# Patient Record
Sex: Female | Born: 1935 | Race: White | Smoking: Never smoker
Health system: Southern US, Community
[De-identification: ages and names within clinical notes are randomized; demographics above are authoritative.]

## PROBLEM LIST (undated history)

## (undated) DIAGNOSIS — C55 Malignant neoplasm of uterus, part unspecified: Secondary | ICD-10-CM

## (undated) DIAGNOSIS — K449 Diaphragmatic hernia without obstruction or gangrene: Secondary | ICD-10-CM

## (undated) DIAGNOSIS — S32009A Unspecified fracture of unspecified lumbar vertebra, initial encounter for closed fracture: Secondary | ICD-10-CM

## (undated) DIAGNOSIS — Z8719 Personal history of other diseases of the digestive system: Secondary | ICD-10-CM

## (undated) DIAGNOSIS — E039 Hypothyroidism, unspecified: Secondary | ICD-10-CM

## (undated) DIAGNOSIS — E785 Hyperlipidemia, unspecified: Secondary | ICD-10-CM

## (undated) DIAGNOSIS — F0390 Unspecified dementia without behavioral disturbance: Secondary | ICD-10-CM

## (undated) DIAGNOSIS — M810 Age-related osteoporosis without current pathological fracture: Secondary | ICD-10-CM

## (undated) DIAGNOSIS — B351 Tinea unguium: Secondary | ICD-10-CM

## (undated) DIAGNOSIS — I059 Rheumatic mitral valve disease, unspecified: Secondary | ICD-10-CM

## (undated) DIAGNOSIS — S22009A Unspecified fracture of unspecified thoracic vertebra, initial encounter for closed fracture: Secondary | ICD-10-CM

## (undated) DIAGNOSIS — K5790 Diverticulosis of intestine, part unspecified, without perforation or abscess without bleeding: Secondary | ICD-10-CM

## (undated) DIAGNOSIS — Z8701 Personal history of pneumonia (recurrent): Secondary | ICD-10-CM

## (undated) DIAGNOSIS — C20 Malignant neoplasm of rectum: Secondary | ICD-10-CM

## (undated) DIAGNOSIS — Z87442 Personal history of urinary calculi: Secondary | ICD-10-CM

## (undated) HISTORY — DX: Personal history of pneumonia (recurrent): Z87.01

## (undated) HISTORY — DX: Tinea unguium: B35.1

## (undated) HISTORY — DX: Rheumatic mitral valve disease, unspecified: I05.9

## (undated) HISTORY — DX: Personal history of urinary calculi: Z87.442

## (undated) HISTORY — DX: Hyperlipidemia, unspecified: E78.5

## (undated) HISTORY — DX: Unspecified fracture of unspecified thoracic vertebra, initial encounter for closed fracture: S22.009A

## (undated) HISTORY — DX: Malignant neoplasm of uterus, part unspecified: C55

## (undated) HISTORY — PX: HAMMER TOE SURGERY: SHX385

## (undated) HISTORY — DX: Unspecified fracture of unspecified lumbar vertebra, initial encounter for closed fracture: S32.009A

## (undated) HISTORY — DX: Personal history of other diseases of the digestive system: Z87.19

## (undated) HISTORY — DX: Unspecified dementia, unspecified severity, without behavioral disturbance, psychotic disturbance, mood disturbance, and anxiety: F03.90

## (undated) HISTORY — DX: Malignant neoplasm of rectum: C20

## (undated) HISTORY — PX: OTHER SURGICAL HISTORY: SHX169

## (undated) HISTORY — DX: Diaphragmatic hernia without obstruction or gangrene: K44.9

## (undated) HISTORY — PX: EYE SURGERY: SHX253

## (undated) HISTORY — PX: FLEXIBLE SIGMOIDOSCOPY: SHX1649

## (undated) HISTORY — DX: Diverticulosis of intestine, part unspecified, without perforation or abscess without bleeding: K57.90

## (undated) HISTORY — DX: Hypothyroidism, unspecified: E03.9

---

## 1989-04-03 DIAGNOSIS — C55 Malignant neoplasm of uterus, part unspecified: Secondary | ICD-10-CM

## 1989-04-03 HISTORY — DX: Malignant neoplasm of uterus, part unspecified: C55

## 1994-08-03 HISTORY — PX: ABDOMINAL HYSTERECTOMY: SHX81

## 2009-12-01 DIAGNOSIS — C20 Malignant neoplasm of rectum: Secondary | ICD-10-CM

## 2009-12-01 HISTORY — PX: COLONOSCOPY: SHX174

## 2009-12-01 HISTORY — DX: Malignant neoplasm of rectum: C20

## 2011-09-10 ENCOUNTER — Encounter: Payer: Self-pay | Admitting: Gastroenterology

## 2011-09-15 ENCOUNTER — Telehealth: Payer: Self-pay | Admitting: Gastroenterology

## 2011-09-15 NOTE — Telephone Encounter (Signed)
Received copies from Dr. Senaida Ores at [facility] 09/15/11. Forwarded 13pages to Dr. Oren Section review.

## 2011-10-15 ENCOUNTER — Ambulatory Visit: Payer: Self-pay | Admitting: Gastroenterology

## 2011-12-10 ENCOUNTER — Ambulatory Visit (INDEPENDENT_AMBULATORY_CARE_PROVIDER_SITE_OTHER): Payer: Medicare Other | Admitting: Internal Medicine

## 2011-12-10 ENCOUNTER — Encounter: Payer: Self-pay | Admitting: Internal Medicine

## 2011-12-10 VITALS — BP 102/56 | HR 72 | Ht 65.0 in | Wt 125.0 lb

## 2011-12-10 DIAGNOSIS — C20 Malignant neoplasm of rectum: Secondary | ICD-10-CM

## 2011-12-10 NOTE — Patient Instructions (Signed)
You have been scheduled for a flexible sigmoidoscopy. Please follow the written instructions given to you at your visit today. 

## 2011-12-10 NOTE — Assessment & Plan Note (Addendum)
Diagnosed at colonoscopy May 2011, [place] N1 s/p EUS at [facility]. Status post 4500 centigrade radiation therapy and chemotherapy. May 2012 flexible sigmoidoscopy, scar but no residual cancer on biopsy in the rectum  She appears to be doing well. Her daughter the patient and I discussed options. It is reasonable to check for recurrent tumor. If she had that there could be other chemotherapy options. Pending the flexible sigmoidoscopy will consider followup with a medical oncologist. He may not need that there is no disease, though the question of cross-sectional imaging could be raised. Explained these ideas to the patient and the daughter and they understood, or least the daughter understood that could be difficult to decide just how much surveillance to do.

## 2011-12-10 NOTE — Progress Notes (Signed)
Subjective:    Patient ID: Sarah [name], female    DOB: [birth year], 78 y.o.   MRN: [id number]  HPI This is a very pleasant, widowed elderly white woman here with her daughter, to establish care in the setting of rectal cancer. She was living in [place], she had rectal bleeding, she had a colonoscopy in May 2011 in the [place] area, and was found to have adenocarcinoma of the rectum. She underwent radiation and chemotherapy, having 4500 cGy from July to August 2011. It looks like she was staged with an endoscopic ultrasound at [facility] as well. Prior to her therapy. Because of her age and the response to chemotherapy radiotherapy it was thought that surgery was not in her best interest. She had a followup sigmoidoscopy in March 2012, scar was seen biopsies did not show any residual cancer. I reviewed these notes and pathology reports as well as treatment summary Notes in the radiation oncologist.  She wishes to establish care with a gastrocolic is no followup. Her primary care physician has performed CEA level, according to her daughter, and apparently is negative. She had a PET CT scan around the time of diagnosis it did not show any metastatic disease either.  Other than some minor constipation she reports no GI problems at this time. Allergies  Allergen Reactions  . Sulfa Antibiotics    Outpatient Prescriptions Prior to Visit  Medication Sig Dispense Refill  . atorvastatin (LIPITOR) 10 MG tablet Take 10 mg by mouth daily.      Marland Kitchen CALCIUM-VITAMIN D PO Take 1 tablet by mouth daily.       . citalopram (CELEXA) 10 MG tablet Take 10 mg by mouth daily.      Marland Kitchen docusate sodium (COLACE) 100 MG capsule Take 100 mg by mouth as needed.      Marland Kitchen levothyroxine (SYNTHROID, LEVOTHROID) 50 MCG tablet Take 50 mcg by mouth daily.      . memantine (NAMENDA) 10 MG tablet Take 10 mg by mouth 2 (two) times daily.      Marland Kitchen acetaminophen (TYLENOL) 500 MG tablet Take 500 mg by mouth.      . diclofenac (VOLTAREN) 50  MG EC tablet Take 50 mg by mouth.      . ENSURE (ENSURE) Take 1 Can by mouth daily.      Marland Kitchen ibuprofen (ADVIL,MOTRIN) 400 MG tablet Take 400 mg by mouth every 6 (six) hours as needed.      . promethazine (PHENERGAN) 25 MG tablet Take 25 mg by mouth.       Past Medical History  Diagnosis Date  . Rectal cancer 12/2009    T3N1, chemoradiation therapy  . Hypothyroidism   . Hyperlipidemia   . Uterine cancer 1990's  . Constipation   . Diverticulosis     descending colon & sigmoid colon  . History of kidney stones   . History of pneumonia    Past Surgical History  Procedure Date  . Abdominal hysterectomy 1990's    b/c uterine cancer  . Colonoscopy 12/2009  . Spinal injection   . Flexible sigmoidoscopy    History   Social History  . Marital Status: Widowed    Spouse Name: N/A    Number of Children: 3  .     Occupational History  . retired    Social History Main Topics  . Smoking status: Never Smoker   . Smokeless tobacco: Never Used  . Alcohol Use: No  . Drug Use: No    Social  History Narrative   Widowed, native of [place], lived in [place] area, moved back to [place] area 2012 or 2013.2 sons one daughter. Daughter lives in [place] and [place]   Family History  Problem Relation Age of Onset  . Heart disease Father   . Coronary artery disease Mother   . Stroke Mother   . Heart attack Brother   . Heart failure Brother   . Aneurysm Father     AAA        Review of Systems She has back pain which is upright and walking too much, she has mild dementia and gets confused at times. All other review of systems negative.    Objective:   Physical Exam General:  Well-developed, well-nourished and in no acute distress Eyes:  anicteric. Lungs: Clear to auscultation bilaterally. Heart:  S1S2, no rubs, murmurs, gallops. Abdomen:  soft, non-tender, no hepatosplenomegaly, hernia, or mass and BS+.  Rectal: deferred Lymph:  no cervical or supraclavicular  adenopathy. Extremities:   no edema + varicosities    Data Reviewed: As mentioned in the history of present illness.         Assessment & Plan:   1.  history of Rectal cancer , T3 N1, status post chemotherapy radiation therapy in 2011

## 2012-01-04 ENCOUNTER — Encounter: Payer: Self-pay | Admitting: Internal Medicine

## 2012-01-04 ENCOUNTER — Ambulatory Visit (AMBULATORY_SURGERY_CENTER): Payer: Medicare Other | Admitting: Internal Medicine

## 2012-01-04 VITALS — BP 145/71 | HR 63 | Temp 98.6°F | Resp 16 | Ht 65.0 in | Wt 125.0 lb

## 2012-01-04 DIAGNOSIS — D129 Benign neoplasm of anus and anal canal: Secondary | ICD-10-CM

## 2012-01-04 DIAGNOSIS — D128 Benign neoplasm of rectum: Secondary | ICD-10-CM

## 2012-01-04 DIAGNOSIS — C20 Malignant neoplasm of rectum: Secondary | ICD-10-CM

## 2012-01-04 MED ORDER — SODIUM CHLORIDE 0.9 % IV SOLN: 500.0000 mL | INTRAVENOUS | Status: DC

## 2012-01-04 NOTE — Patient Instructions (Addendum)
YOU HAD AN ENDOSCOPIC PROCEDURE TODAY AT THE [facility]: Refer to the procedure report that was given to you for any specific questions about what was found during the examination.  If the procedure report does not answer your questions, please call your gastroenterologist to clarify.  If you requested that your care partner not be given the details of your procedure findings, then the procedure report has been included in a sealed envelope for you to review at your convenience later.  YOU SHOULD EXPECT: Some feelings of bloating in the abdomen. Passage of more gas than usual.  Walking can help get rid of the air that was put into your GI tract during the procedure and reduce the bloating. If you had a lower endoscopy (such as a colonoscopy or flexible sigmoidoscopy) you may notice spotting of blood in your stool or on the toilet paper. If you underwent a bowel prep for your procedure, then you may not have a normal bowel movement for a few days.  DIET: Your first meal following the procedure should be a light meal and then it is ok to progress to your normal diet.  A half-sandwich or bowl of soup is an example of a good first meal.  Heavy or fried foods are harder to digest and may make you feel nauseous or bloated.  Likewise meals heavy in dairy and vegetables can cause extra gas to form and this can also increase the bloating.  Drink plenty of fluids but you should avoid alcoholic beverages for 24 hours.  ACTIVITY: Your care partner should take you home directly after the procedure.  You should plan to take it easy, moving slowly for the rest of the day.  You can resume normal activity the day after the procedure however you should NOT DRIVE or use heavy machinery for 24 hours (because of the sedation medicines used during the test).    SYMPTOMS TO REPORT IMMEDIATELY: A gastroenterologist can be reached at any hour.  During normal business hours, 8:30 AM to 5:00 PM Monday through Friday,  call [phone].  After hours and on weekends, please call the GI answering service at [phone] who will take a message and have the physician on call contact you.   Following lower endoscopy (colonoscopy or flexible sigmoidoscopy):  Excessive amounts of blood in the stool  Significant tenderness or worsening of abdominal pains  Swelling of the abdomen that is new, acute  Fever of 100F or higher    FOLLOW UP: If any biopsies were taken you will be contacted by phone or by letter within the next 1-3 weeks.  Call your gastroenterologist if you have not heard about the biopsies in 3 weeks.  Our staff will call the home number listed on your records the next business day following your procedure to check on you and address any questions or concerns that you may have at that time regarding the information given to you following your procedure. This is a courtesy call and so if there is no answer at the home number and we have not heard from you through the emergency physician on call, we will assume that you have returned to your regular daily activities without incident.  SIGNATURES/CONFIDENTIALITY: You and/or your care partner have signed paperwork which will be entered into your electronic medical record.  These signatures attest to the fact that that the information above on your After Visit Summary has been reviewed and is understood.  Full responsibility of the confidentiality   of this discharge information lies with you and/or your care-partner.     

## 2012-01-04 NOTE — Progress Notes (Signed)
No sedation for the flex sigmoidoscopy per Dr. Leone Payor.  The pt tolerated the flex sig very well.  No grimacing or any signs of discomfort noticed throughout the exam. Maw

## 2012-01-04 NOTE — Progress Notes (Signed)
The pt has a green HIPPA sign hanging on IV pole. Maw

## 2012-01-04 NOTE — Progress Notes (Signed)
Patient did not have preoperative order for IV antibiotic SSI prophylaxis. (G8918)  Patient did not experience any of the following events: a burn prior to discharge; a fall within the facility; wrong site/side/patient/procedure/implant event; or a hospital transfer or hospital admission upon discharge from the facility. (G8907)  

## 2012-01-04 NOTE — Progress Notes (Addendum)
Dr. Leone Payor stated that he would call the patient's daughter, and tell her what was going on.   He also stated no to give the driver from the assisted living center any information.    The person just signed the post op sheet without information as ordered. SDH

## 2012-01-04 NOTE — Op Note (Signed)
[facility] [address] [place], [place]  [address]  FLEXIBLE SIGMOIDOSCOPY PROCEDURE REPORT  PATIENT:  Sarah, [name]  MR#:  [id number] BIRTHDATE:  Apr 08, 1924, 87 yrs. old  GENDER:  female  ENDOSCOPIST:  Iva Boop, MD, [facility] Referred by:  Bufford Spikes, M.D.  PROCEDURE DATE:  01/04/2012 PROCEDURE:  Flexible Sigmoidoscopy with biopsy ASA CLASS:  Class III INDICATIONS:  follow-up of cancer rectal cancer diagnosed in May 2011, treated with XRT and chemotherapy ([facility])  MEDICATIONS:   none  DESCRIPTION OF PROCEDURE:   After the risks benefits and alternatives of the procedure were thoroughly explained, informed consent was obtained.  Digital rectal exam was performed and revealed no abnormalities.   The [id number] endoscope was introduced through the anus and advanced to the sigmoid colon, without limitations.  The quality of the prep was good.  The instrument was then slowly withdrawn as the mucosa was fully examined. <<PROCEDUREIMAGES>>  The mucosa was abnormal. in the rectum. Raised and erythematous mucosa suspicious for persistent cancer in distal rectum. Seen best on retroflexion. Multiple biopsies were obtained and sent to pathology.  Mild diverticulosis was found in the sigmoid colon. Retroflexed views in the rectum revealed no abnormalities.    The scope was then withdrawn from the patient and the procedure terminated.  COMPLICATIONS:  None  ENDOSCOPIC IMPRESSION: 1) Abnormal mucosa in the rectum - suspicious for persistent carcinoma - could also be radiation change 2) Mild diverticulosis in the sigmoid colon RECOMMENDATIONS: 1) await biopsy results Will contact daughter with results if possible - can also arrange office follow-up if needed.  Iva Boop, MD, [facility]  CC:  Bufford Spikes, DO and The Patient  n. eSIGNED:   Iva Boop at 01/04/2012 02:51 PM  Kela Millin, [phone]

## 2012-01-05 ENCOUNTER — Telehealth: Payer: Self-pay | Admitting: Internal Medicine

## 2012-01-05 ENCOUNTER — Telehealth: Payer: Self-pay | Admitting: *Deleted

## 2012-01-05 DIAGNOSIS — C20 Malignant neoplasm of rectum: Secondary | ICD-10-CM

## 2012-01-05 NOTE — Telephone Encounter (Signed)
Unable to call patient.  She lives in an Assisted Living center.  Unable to call patient.  No # listed.  Daughter is out of town, and hers is the only number.

## 2012-01-05 NOTE — Telephone Encounter (Signed)
Spoke with daughter about flex sig results, explained that Dr. Leone Payor would be calling her when pathology results are available. Patient thanked [place] for our help. Advised her to call us if she needed anything else.

## 2012-01-08 NOTE — Telephone Encounter (Signed)
I explained results to daughter Plan is to get EUS report from [facility] so I understand the situation better prior to XRT and chemo.  Considering endoscopic treatment of this rectal polyp

## 2012-01-08 NOTE — Progress Notes (Signed)
Quick Note:  No letter or recall I called daughter and gave results Follow-up plans will depend upon review of EUS report from 2011 ______

## 2012-01-11 NOTE — Telephone Encounter (Signed)
Need Endoscopic ultrasound report from [facility] which was done in 2011, I think.  Please try to get that - if we need ROI the daughter has HCPOA and we could probably fax ROI back and forth for signature from her  Note that daughter will be going to [place] tomorrow so call her after 12 our time due to time change

## 2012-01-12 NOTE — Telephone Encounter (Signed)
Contacted daughter, Clinton Gallant at [phone] regarding ROI form.  We have mailed it to her at: [address] [place]  [phone] per her request.  She is going to get these back to [place] ASAP.  She took [facility] MR # to call to see if they will take a verbal from her to release the information to [place] or perhaps she thought a previous Dr. May have the report we need, she plans to check into this.

## 2012-01-14 ENCOUNTER — Telehealth: Payer: Self-pay | Admitting: Internal Medicine

## 2012-01-15 NOTE — Telephone Encounter (Signed)
Spoke to [employer] and faxed note to [facility] to request the report Dr. Leone Payor needs.  Dawn said to call her back with anything else we need.

## 2012-02-09 NOTE — Telephone Encounter (Signed)
Called Dawn and left message re: I will call again to discuss things

## 2012-02-22 ENCOUNTER — Telehealth: Payer: Self-pay | Admitting: Internal Medicine

## 2012-02-22 NOTE — Telephone Encounter (Signed)
See lab result notes

## 2012-02-22 NOTE — Telephone Encounter (Signed)
Left a message for patient's daughter to call me. 

## 2012-03-01 NOTE — Telephone Encounter (Signed)
I have left a message and referral has been made to oncology.

## 2012-03-01 NOTE — Addendum Note (Signed)
Addended by: Annett Fabian on: 03/01/2012 02:49 PM   Modules accepted: Orders

## 2012-03-01 NOTE — Telephone Encounter (Signed)
I spoke to daughter -   Plan is for patient to see dr. Truett Perna - please make a referral re; rectal cancer (treated elsewhere- [facility]) follow-up care  Now with adenomatous tissue in rectum  Will need to coordinate timing of appointment with daughter - Clinton Gallant at # in the notes below.

## 2012-03-03 ENCOUNTER — Telehealth: Payer: Self-pay | Admitting: *Deleted

## 2012-03-03 NOTE — Telephone Encounter (Signed)
Received Oncology referral to Dr. Truett Perna this morning.  Attempted to call patient's daughter, Clinton Gallant, but got answering machine.  This RN left voice message with call back number.  Will continue to follow-up.

## 2012-03-03 NOTE — Telephone Encounter (Signed)
Patient's daughter and POA, Clinton Gallant,  returned phone call to this RN.  Her mother has re-located to [facility] and wants continued Oncologic follow-up care with Dr. Truett Perna.  She was treated for rectal cancer in 2011 in [place] and recently saw Dr. Leone Payor, here in [place], who  referred the patient.  Patient's daughter is requesting to be seen close to August 22nd as she will be here in town to care for her mother.  THis RN gave contact phone number and explained navigator role.  THis RN explained to daughter that a scheduler will call back with an appointment and that [facility] will work to get appointment as close to the 22nd as possible.

## 2012-03-07 ENCOUNTER — Telehealth: Payer: Self-pay | Admitting: Oncology

## 2012-03-07 NOTE — Telephone Encounter (Signed)
Talked to pt's daughter, she is aware of location and appt date

## 2012-03-24 ENCOUNTER — Ambulatory Visit (HOSPITAL_BASED_OUTPATIENT_CLINIC_OR_DEPARTMENT_OTHER): Payer: Medicare Other | Admitting: Oncology

## 2012-03-24 ENCOUNTER — Other Ambulatory Visit: Payer: Medicare Other | Admitting: Lab

## 2012-03-24 ENCOUNTER — Ambulatory Visit (HOSPITAL_BASED_OUTPATIENT_CLINIC_OR_DEPARTMENT_OTHER): Payer: Medicare Other | Admitting: Lab

## 2012-03-24 ENCOUNTER — Telehealth: Payer: Self-pay | Admitting: Oncology

## 2012-03-24 ENCOUNTER — Ambulatory Visit: Payer: Medicare Other

## 2012-03-24 VITALS — BP 104/61 | HR 77 | Temp 97.3°F | Resp 18 | Ht 65.0 in | Wt 127.3 lb

## 2012-03-24 DIAGNOSIS — C20 Malignant neoplasm of rectum: Secondary | ICD-10-CM

## 2012-03-24 DIAGNOSIS — F0390 Unspecified dementia without behavioral disturbance: Secondary | ICD-10-CM

## 2012-03-24 NOTE — Progress Notes (Signed)
[facility] New Patient Consult   Referring MD: Avarae Zwart 78 y.o.  Feb 03, 1924    Reason for Referral: History of rectal cancer     HPI: She presented with rectal bleeding in May of 2011. A colonoscopy confirmed a 2 cm ulcerated rectal lesion with the pathology confirming "at least intramucosal adenocarcinoma ". There was a benign polyp in the rectum.  Staging CT scans showed no evidence for pelvic lymphadenopathy or metastatic disease. A CEA was normal at 1.6 on 01/12/2010. A rectal ultrasound on 01/15/2010 at [facility] confirmed a 2 cm mass in the rectum which appeared to involve the dentate line. The mass invaded the muscularis propria and a single 9 mm perirectal lymph node was seen. The staging by endoscopic ultrasound was T3 N1.  She was treated with Xeloda and concurrent radiation, completed in August of 2011. She elected to not undergo surgical resection at the completion of chemotherapy and radiation. A PET scan on 05/06/2010 showed no evidence of malignancy. A followup flexible sigmoidoscopy in March of 2012 revealed benign findings.  She recently relocated to [facility] and is living independently in a retirement community. She was referred to Dr. Leone Payor for a followup endoscopic evaluation. She was taken to a flexible sigmoidoscopy procedure on 01/04/2012. The mucosa was abnormal in the rectum. Raised and erythematous mucosa suspicious for persistent cancer was noted in the distal rectum. Multiple biopsies were obtained. Mild diverticulosis was noted in the sigmoid colon. The pathology revealed a fragment of tubulovillous adenoma in 2 fragments of benign colonic mucosa with prolapse changes. No high-grade dysplasia or evidence of malignancy was identified.   She feels well. She denies difficulty with bowel or bladder function. No rectal pain or bleeding.  Past Medical History  Diagnosis Date  . Rectal cancer 12/2009    uT3,uN1, treated with  Xeloda and radiation completed in August of 2011   . Hypothyroidism   . Hyperlipidemia   . Uterine cancer 1990's  . Constipation   . Diverticulosis     descending colon & sigmoid colon  . History of kidney stones   . History of pneumonia    .    G4, P3, one miscarriage  .    Dementia  Past Surgical History  Procedure Date  . Abdominal hysterectomy 1990's    b/c uterine cancer  .  foot surgery on 3 occasions, "hammer toes "   . Spinal injection   .    Marland Kitchen Cataracts     Family history: She has 6 siblings. 3 sisters had muscular dystrophy. A brother had Alzheimer's. No family history of cancer including colon and rectal cancer. Current outpatient prescriptions:atorvastatin (LIPITOR) 10 MG tablet, Take 10 mg by mouth daily., Disp: , Rfl: ;  CALCIUM-VITAMIN D PO, Take 1 tablet by mouth daily. , Disp: , Rfl: ;  citalopram (CELEXA) 10 MG tablet, Take 10 mg by mouth daily., Disp: , Rfl: ;  diclofenac (FLECTOR) 1.3 % PTCH, Place 1 patch onto the skin daily as needed., Disp: , Rfl: ;  docusate sodium (COLACE) 100 MG capsule, Take 100 mg by mouth as needed., Disp: , Rfl:  levothyroxine (SYNTHROID, LEVOTHROID) 50 MCG tablet, Take 50 mcg by mouth daily., Disp: , Rfl: ;  memantine (NAMENDA) 10 MG tablet, Take 28 mg by mouth daily. , Disp: , Rfl: ;  rivastigmine (EXELON) 9.5 mg/24hr, Place 1 patch onto the skin daily., Disp: , Rfl:   Allergies:  Allergies  Allergen Reactions  .  Sulfa Antibiotics     Social History: She lives independently in a retirement community. She previously worked as a Haematologist. She does not use tobacco or alcohol. No risk factor for HIV or hepatitis.    ROS:   Positives include: Difficulty walking secondary to foot surgery, kyphosis, and altered balance  A complete ROS was otherwise negative.  Physical Exam:  Blood pressure 104/61, pulse 77, temperature 97.3 F (36.3 C), temperature source Oral, resp. rate 18, height 5\' 5"  (1.651 m), weight 127 lb 4.8 oz (57.743  kg).  HEENT: Oropharynx without visible mass, neck without mass Lungs: Clear bilaterally Cardiac: Regular rate and rhythm Abdomen: No hepatosplenomegaly, ventral hernia, no mass, no apparent ascites  Vascular: No leg edema Lymph nodes:  No cervical, supraclavicular, axillary, or inguinal nodes Neurologic: Alert, the motor exam appears intact in the upper and lower extremities, not oriented to year Skin: No rash Rectal: Slight irregularity over the anterior rectal mucosa at the tip of the examination finger. No discrete mass. Brown stool in the rectal vault.   LAB: CEA pending Radiology: None   Assessment/Plan:   1. Rectal cancer (uT3,uN1) diagnosed in May of 2011 and treated with concurrent Xeloda and radiation  2. Flexible sigmoidoscopy on 01/04/2012 with "abnormal mucosa in the rectum "suspicious for persistent carcinoma-status post a biopsy revealing a fragment of tubulovillous adenoma and 2 fragments of benign colonic mucosa with prolapse changes  3. Dementia  Disposition:   She has a history of rectal cancer. She was treated with primary chemotherapy/radiation and elected to not undergo definitive surgery. She remains in clinical remission.  Ms. Auer has no symptoms to suggest recurrent rectal cancer. She has significant dementia. She would not be a candidate to undergo an APR procedure for cure if she were found to have a local recurrence. She may be a candidate for palliative local measures if she were found to have progression of tumor in the rectum.  The patient and her daughter are comfortable with an observation approach. We decided against surveillance CT scans. The CEA will be checked today and when she returns in 6 months. I discussed the case with Dr. Leone Payor. He plans a followup sigmoidoscopy at a one-year interval.  The patient's daughter will contact us in the interim if she develops rectal bleeding, pain, or other symptoms concerning for recurrent rectal  cancer.  Pessy Delamar 03/24/2012, 5:55 PM

## 2012-03-24 NOTE — Telephone Encounter (Signed)
appts made and printed for pt  °

## 2012-03-25 ENCOUNTER — Telehealth: Payer: Self-pay | Admitting: *Deleted

## 2012-03-25 NOTE — Telephone Encounter (Signed)
Spoke with pt daughter regarding lab results, per Dr. Truett Perna lab looks good and to come back as schedule.  Pt daughter verbalized understanding.

## 2012-04-05 ENCOUNTER — Telehealth: Payer: Self-pay

## 2012-04-05 NOTE — Telephone Encounter (Signed)
Message copied by Annett Fabian on Tue Apr 05, 2012  2:20 PM ------      Message from: Iva Boop      Created: Sun Apr 03, 2012  2:38 PM      Regarding: needs sigmoioscopy recall 1 year from last please       As above            Thanks

## 2012-04-05 NOTE — Telephone Encounter (Signed)
Recall placed for 01/01/2013

## 2012-04-28 ENCOUNTER — Ambulatory Visit (HOSPITAL_COMMUNITY)
Admission: RE | Admit: 2012-04-28 | Discharge: 2012-04-28 | Disposition: A | Discharge status: Home / Self Care | Payer: Medicare Other | Source: Ambulatory Visit | Attending: Internal Medicine | Admitting: Internal Medicine

## 2012-04-28 ENCOUNTER — Encounter (HOSPITAL_COMMUNITY): Payer: Self-pay

## 2012-04-28 DIAGNOSIS — M810 Age-related osteoporosis without current pathological fracture: Secondary | ICD-10-CM | POA: Insufficient documentation

## 2012-04-28 HISTORY — DX: Age-related osteoporosis without current pathological fracture: M81.0

## 2012-04-28 MED ORDER — SODIUM CHLORIDE 0.9 % IV SOLN
Freq: Once | INTRAVENOUS | Status: AC
  Administered 2012-04-28: 14:00:00 via INTRAVENOUS

## 2012-04-28 MED ORDER — ZOLEDRONIC ACID 5 MG/100ML IV SOLN
5.0000 mg | Freq: Once | INTRAVENOUS | Status: AC
  Administered 2012-04-28: 5 mg via INTRAVENOUS
  Filled 2012-04-28: qty 100

## 2012-07-18 ENCOUNTER — Ambulatory Visit
Admission: RE | Admit: 2012-07-18 | Discharge: 2012-07-18 | Disposition: A | Discharge status: Home / Self Care | Payer: Medicare Other | Source: Ambulatory Visit | Attending: Internal Medicine | Admitting: Internal Medicine

## 2012-07-18 ENCOUNTER — Other Ambulatory Visit: Payer: Self-pay | Admitting: Internal Medicine

## 2012-07-18 DIAGNOSIS — K5900 Constipation, unspecified: Secondary | ICD-10-CM

## 2012-09-14 ENCOUNTER — Telehealth: Payer: Self-pay | Admitting: Oncology

## 2012-09-15 ENCOUNTER — Ambulatory Visit: Payer: Medicare Other | Admitting: Oncology

## 2012-09-15 ENCOUNTER — Other Ambulatory Visit: Payer: Medicare Other | Admitting: Lab

## 2012-11-10 ENCOUNTER — Telehealth: Payer: Self-pay | Admitting: Internal Medicine

## 2012-11-10 ENCOUNTER — Other Ambulatory Visit (HOSPITAL_BASED_OUTPATIENT_CLINIC_OR_DEPARTMENT_OTHER): Payer: Medicare Other | Admitting: Lab

## 2012-11-10 ENCOUNTER — Telehealth: Payer: Self-pay | Admitting: Oncology

## 2012-11-10 ENCOUNTER — Ambulatory Visit (HOSPITAL_BASED_OUTPATIENT_CLINIC_OR_DEPARTMENT_OTHER): Payer: Medicare Other | Admitting: Oncology

## 2012-11-10 VITALS — BP 120/72 | HR 75 | Temp 97.2°F | Resp 18 | Ht 63.5 in | Wt 131.2 lb

## 2012-11-10 DIAGNOSIS — Z85048 Personal history of other malignant neoplasm of rectum, rectosigmoid junction, and anus: Secondary | ICD-10-CM

## 2012-11-10 DIAGNOSIS — C20 Malignant neoplasm of rectum: Secondary | ICD-10-CM

## 2012-11-10 NOTE — Telephone Encounter (Signed)
gv and printed appt schedule for pt for Dec...Marland KitchenMarland KitchenCalled Dr. Marvell Fuller office to schedule sigmoidoscopy..the patient daughter was concerned about weather the pt needed that procedure and wanted Cordelia Pen the nurse to call her to schedule the appt

## 2012-11-10 NOTE — Progress Notes (Signed)
   [facility]    OFFICE PROGRESS NOTE   INTERVAL HISTORY:   She returns as scheduled. She reports a good appetite. She is ambulating with a walker. No rectal bleeding. Mild constipation.  Objective:  Vital signs in last 24 hours:  Blood pressure 120/72, pulse 75, temperature 97.2 F (36.2 C), temperature source Oral, resp. rate 18, height 5' 3.5" (1.613 m), weight 131 lb 3.2 oz (59.512 kg).    HEENT: Neck without mass Lymphatics: No cervical, supra-clavicular, axillary, or inguinal nodes Resp: End inspiratory rhonchi at the left posterior base, no respiratory distress Cardio: Regular rate and rhythm GI: Nontender, no hepatomegaly, no mass Vascular: No leg edema  Medications: I have reviewed the patient's current medications.  Labs: CEA on 03/24/2012-0.7, pending today   Assessment/Plan: 1. Rectal cancer (uT3,uN1) diagnosed in May of 2011 and treated with concurrent Xeloda and radiation  2. Flexible sigmoidoscopy on 01/04/2012 with "abnormal mucosa in the rectum "suspicious for persistent carcinoma-status post a biopsy revealing a fragment of tubulovillous adenoma and 2 fragments of benign colonic mucosa with prolapse changes  3. Dementia    Disposition:  She remains in clinical remission from rectal cancer. We will refer her to Dr. Leone Payor for the one year surveillance sigmoidoscopy. Ms. Durango would like to continue followup at cancer Center. She will return for an office visit and CEA in 8 months.   [name], MD  11/10/2012  4:55 PM

## 2012-11-11 ENCOUNTER — Telehealth: Payer: Self-pay | Admitting: Oncology

## 2012-11-11 NOTE — Telephone Encounter (Signed)
Mr Cordelia Pen nurse from Dr. Teresita Madura office called back and advised that pt really needs sigmoid in June..the patient is currently on call list ....she will contact Dr Truett Perna and the daughter to express that the pt needs the procedure in June.

## 2012-11-11 NOTE — Telephone Encounter (Signed)
Patient is scheduled for an office visit for may, she is due for a flex in June and the daughter would like to talk with Dr. Leone Payor previously.

## 2012-11-14 ENCOUNTER — Telehealth: Payer: Self-pay | Admitting: *Deleted

## 2012-11-14 NOTE — Telephone Encounter (Signed)
Message copied by Wandalee Ferdinand on Mon Nov 14, 2012  9:09 AM ------      Message from: [name] B      Created: Fri Nov 11, 2012  7:02 AM       Please call patient,cea is normal, please call daughter ------

## 2012-11-14 NOTE — Telephone Encounter (Signed)
Daughter notified of CEA result.

## 2012-12-30 ENCOUNTER — Ambulatory Visit (INDEPENDENT_AMBULATORY_CARE_PROVIDER_SITE_OTHER): Payer: Medicare Other | Admitting: Internal Medicine

## 2012-12-30 ENCOUNTER — Encounter: Payer: Self-pay | Admitting: Internal Medicine

## 2012-12-30 VITALS — BP 110/70 | HR 76 | Ht 63.0 in | Wt 132.0 lb

## 2012-12-30 DIAGNOSIS — D128 Benign neoplasm of rectum: Secondary | ICD-10-CM

## 2012-12-30 DIAGNOSIS — K5900 Constipation, unspecified: Secondary | ICD-10-CM

## 2012-12-30 DIAGNOSIS — Z85048 Personal history of other malignant neoplasm of rectum, rectosigmoid junction, and anus: Secondary | ICD-10-CM

## 2012-12-30 MED ORDER — BISACODYL 10 MG/30ML RE ENEM: 10.0000 mg | ENEMA | Freq: Once | RECTAL | Status: DC

## 2012-12-30 NOTE — Patient Instructions (Addendum)
You have been scheduled for a flexible sigmoidoscopy. Please follow the written instructions given to you at your visit today. If you use inhalers (even only as needed), please bring them with you on the day of your procedure.  Thank you for choosing me and [facility].  Carl E. Gessner, M.D., FACG  

## 2012-12-30 NOTE — Progress Notes (Signed)
  Subjective:    Patient ID: Sarah [name], female    DOB: 12/24/1923, 79 y.o.   MRN: [id number]  HPI The patient is here with her daughter. She has a history of rectal cancer that was treated but not resected, last year flexible sigmoidoscopy showed adenomatous polyp in the rectum an area of scar. She is being followed by oncology. Though she is demented she is still active and living an independent situation with some help. Should she have recurrent cancer her daughter is open to pursuing treatment for that.  She does have some constipation lately, MiraLax is increased and they're trying to get her to be more active.  Medications, allergies, past medical history, past surgical history, family history and social history are reviewed and updated in the EMR.  Review of Systems As above    Objective:   Physical Exam General:  NAD, elderly pleasantly demented woman Eyes:   anicteric Lungs:  clear Heart:  S1S2 no rubs, murmurs or gallops Abdomen:  soft and nontender, BS+    Data Reviewed:  2013 sigmoidoscopy note, recent note from oncology Lab Results  Component Value Date   CEA 0.6 11/10/2012      Assessment & Plan:   1. Benign neoplasm of rectum and anal canal   2. Personal history of rectal cancer  Constipation   1. We discussed the pros and cons of watchful waiting versus inspection of the rectal area. 2. The patient's daughter is her power of attorney, we have decided to proceed with a limited sigmoidoscopy exam to look for recurrent cancer. 3. I can take biopsies and perhaps perform some cold snare polypectomy to remove some tissue but we have decided not to pursue a full colonoscopy prep in this lady so snare cautery would not be done at this time. The risks and benefits as well as alternatives of endoscopic procedure(s) have been discussed and reviewed. All questions answered. The patient agrees to proceed. Will not use sedation she tolerated this without sedation last  year. Agree with conservative measures to treat constipation

## 2013-01-27 ENCOUNTER — Encounter: Payer: Self-pay | Admitting: Internal Medicine

## 2013-01-27 ENCOUNTER — Ambulatory Visit (AMBULATORY_SURGERY_CENTER): Payer: Medicare Other | Admitting: Internal Medicine

## 2013-01-27 VITALS — BP 152/73 | HR 63 | Temp 96.9°F | Resp 29 | Ht 63.0 in | Wt 132.0 lb

## 2013-01-27 DIAGNOSIS — Z85048 Personal history of other malignant neoplasm of rectum, rectosigmoid junction, and anus: Secondary | ICD-10-CM

## 2013-01-27 DIAGNOSIS — D128 Benign neoplasm of rectum: Secondary | ICD-10-CM

## 2013-01-27 DIAGNOSIS — D129 Benign neoplasm of anus and anal canal: Secondary | ICD-10-CM

## 2013-01-27 NOTE — Progress Notes (Signed)
No IV per Dr. Leone Payor

## 2013-01-27 NOTE — Op Note (Signed)
[facility] [address] [place], [address]   FLEXIBLE SIGMOIDOSCOPY PROCEDURE REPORT  PATIENT: Sarah [name], Sarah [name]  MR#: [id number] BIRTHDATE: Dec 29, 1923 , 88  yrs. old GENDER: Female ENDOSCOPIST: Iva Boop, MD, [facility] PROCEDURE DATE:  01/27/2013 PROCEDURE:   Sigmoidoscopy with biopsy ASA CLASS:   Class III INDICATIONS:high risk patient with personal history of rectal cancer. MEDICATIONS: None  DESCRIPTION OF PROCEDURE:   After the risks benefits and alternatives of the procedure were thoroughly explained, informed consent was obtained.  revealed no abnormalities of the rectum. The LB [id number]  endoscope was introduced through the anus and advanced to the sigmoid colon , limited by No adverse events experienced.   The quality of the prep was excellent .  The instrument was then slowly withdrawn as the mucosa was fully examined.       1.  COLON FINDINGS: A sessile polyp measuring 15 mm in size was found in the rectum. In distal rectum near anal verge. Multiple biopsies were performed using cold forceps and snare. 2.  Diverticulosis was noted in the sigmoid colon. Retroflexed views revealed no abnormalities.    The scope was then withdrawn from the patient and the procedure terminated.  COMPLICATIONS: There were no complications.  ENDOSCOPIC IMPRESSION: 1.   Sessile polyp measuring 15 mm in size was found in the rectum; multiple biopsies were performed using cold forceps 2.   Diverticulosis was noted in the sigmoid colon  RECOMMENDATIONS: await biopsy results - do not think this lesion is a threat to her health at this time - it had a firm base ? from prior XRT vs. lesion itself - doubt it could be removed with scope due to firm base but could consider ablative Tx   l eSigned:  Iva Boop, MD, [facility] 01/27/2013 4:15 PM   FA:[id number] Truett Perna, MD The Patient

## 2013-01-27 NOTE — Progress Notes (Signed)
Patient did not experience any of the following events: a burn prior to discharge; a fall within the facility; wrong site/side/patient/procedure/implant event; or a hospital transfer or hospital admission upon discharge from the facility. (G8907) Patient did not have preoperative order for IV antibiotic SSI prophylaxis. (G8918)  

## 2013-01-27 NOTE — Progress Notes (Signed)
Called to room to assist during endoscopic procedure.  Patient ID and intended procedure confirmed with present staff. Received instructions for my participation in the procedure from the performing physician.  

## 2013-01-27 NOTE — Progress Notes (Signed)
Report to pacu rn, vss, bbs=clear, alert and oriented x3, MAE x4,comfortable and doing well. (Time [id number])

## 2013-01-27 NOTE — Patient Instructions (Addendum)
There is a growth in the rectum that looks like a polyp returning. I took biopsies to see what it is - polyp vs. Tumor. There may be some slight bleeding from this for a few days but should stop on its on.  YOU HAD AN ENDOSCOPIC PROCEDURE TODAY AT THE [facility]: Refer to the procedure report that was given to you for any specific questions about what was found during the examination.  If the procedure report does not answer your questions, please call your gastroenterologist to clarify.  If you requested that your care partner not be given the details of your procedure findings, then the procedure report has been included in a sealed envelope for you to review at your convenience later.  YOU SHOULD EXPECT: Some feelings of bloating in the abdomen. Passage of more gas than usual.  Walking can help get rid of the air that was put into your GI tract during the procedure and reduce the bloating. If you had a lower endoscopy (such as a colonoscopy or flexible sigmoidoscopy) you may notice spotting of blood in your stool or on the toilet paper. If you underwent a bowel prep for your procedure, then you may not have a normal bowel movement for a few days.  DIET: Your first meal following the procedure should be a light meal and then it is ok to progress to your normal diet.  A half-sandwich or bowl of soup is an example of a good first meal.  Heavy or fried foods are harder to digest and may make you feel nauseous or bloated.  Likewise meals heavy in dairy and vegetables can cause extra gas to form and this can also increase the bloating.  Drink plenty of fluids but you should avoid alcoholic beverages for 24 hours.  ACTIVITY: Your care partner should take you home directly after the procedure.  You should plan to take it easy, moving slowly for the rest of the day.  You can resume normal activity the day after the procedure however you should NOT DRIVE or use heavy machinery for 24 hours (because  of the sedation medicines used during the test).    SYMPTOMS TO REPORT IMMEDIATELY: A gastroenterologist can be reached at any hour.  During normal business hours, 8:30 AM to 5:00 PM Monday through Friday, call [phone].  After hours and on weekends, please call the GI answering service at [phone] who will take a message and have the physician on call contact you.   Following lower endoscopy (colonoscopy or flexible sigmoidoscopy):  Excessive amounts of blood in the stool  Significant tenderness or worsening of abdominal pains  Swelling of the abdomen that is new, acute  Fever of 100F or higher  FOLLOW UP: If any biopsies were taken you will be contacted by phone or by letter within the next 1-3 weeks.  Call your gastroenterologist if you have not heard about the biopsies in 3 weeks.  Our staff will call the home number listed on your records the next business day following your procedure to check on you and address any questions or concerns that you may have at that time regarding the information given to you following your procedure. This is a courtesy call and so if there is no answer at the home number and we have not heard from you through the emergency physician on call, we will assume that you have returned to your regular daily activities without incident.  SIGNATURES/CONFIDENTIALITY: You and/or your care  partner have signed paperwork which will be entered into your electronic medical record.  These signatures attest to the fact that that the information above on your After Visit Summary has been reviewed and is understood.  Full responsibility of the confidentiality of this discharge information lies with you and/or your care-partner.  Polyp and diverticulosis information given.

## 2013-01-30 ENCOUNTER — Telehealth: Payer: Self-pay | Admitting: *Deleted

## 2013-01-30 NOTE — Telephone Encounter (Signed)
  Follow up Call-  Call back number 01/27/2013 01/04/2012  Post procedure Call Back phone  # [phone]- daughter;s phone [phone] [address]/ [place] [phone]  Permission to leave phone message Yes No     Patient questions:  Do you have a fever, pain , or abdominal swelling? no Pain Score  0 *  Have you tolerated food without any problems? yes  Have you been able to return to your normal activities? yes  Do you have any questions about your discharge instructions: Diet   no Medications  no Follow up visit  no  Do you have questions or concerns about your Care? no  Actions: * If pain score is 4 or above: No action needed, pain <4. Spoke with pts daughter as per instructions from Friday's visit and daughter states pt had minimal bleeding, no fever, has tolerated foods well. States no problems. ewm

## 2013-01-31 ENCOUNTER — Other Ambulatory Visit: Payer: Self-pay | Admitting: *Deleted

## 2013-02-02 ENCOUNTER — Encounter: Payer: Self-pay | Admitting: Internal Medicine

## 2013-02-02 DIAGNOSIS — B351 Tinea unguium: Secondary | ICD-10-CM

## 2013-02-02 HISTORY — DX: Tinea unguium: B35.1

## 2013-02-06 ENCOUNTER — Telehealth: Payer: Self-pay

## 2013-02-06 NOTE — Telephone Encounter (Signed)
Message copied by Annett Fabian on Mon Feb 06, 2013  4:01 PM ------      Message from: Stan Head E      Created: Thu Feb 02, 2013  4:36 PM      Regarding: pathology call       Please call daughter (patient is demented)            Rectal polyp is tubular adenoma      No signs of cancer            We can reassess in 1 year if needed - will have [facility] place 1 year rev recall to consider repeat exam ------

## 2013-02-06 NOTE — Telephone Encounter (Signed)
Patient's daughter notified.

## 2013-04-17 ENCOUNTER — Encounter: Payer: Self-pay | Admitting: Podiatrist

## 2013-04-17 DIAGNOSIS — B351 Tinea unguium: Secondary | ICD-10-CM | POA: Insufficient documentation

## 2013-05-03 ENCOUNTER — Ambulatory Visit: Payer: Self-pay | Admitting: Podiatrist

## 2013-05-03 ENCOUNTER — Ambulatory Visit (INDEPENDENT_AMBULATORY_CARE_PROVIDER_SITE_OTHER): Payer: Medicare Other | Admitting: Podiatrist

## 2013-05-03 ENCOUNTER — Encounter: Payer: Self-pay | Admitting: Podiatrist

## 2013-05-03 DIAGNOSIS — B351 Tinea unguium: Secondary | ICD-10-CM

## 2013-05-03 DIAGNOSIS — M79609 Pain in unspecified limb: Secondary | ICD-10-CM

## 2013-05-03 NOTE — Patient Instructions (Signed)
Toenails were debrided at today's visit.  Call if any problems arise

## 2013-05-03 NOTE — Progress Notes (Signed)
HPI:  Patient presents today for follow up of foot and nail care. Denies any new complaints today.  Objective:  Patients chart is reviewed.  Neurovascular status unchanged.  Patients nails are thickened, discolored, distrophic, friable and brittle with yellow-brown discoloration. Patient subjectively relates they are painful with shoes and with ambulation in both feet  Assessment:  Symptomatic onychomycosis  Plan:  Discussed treatment options and alternatives.  The symptomatic toenails were debrided through manual an mechanical means without complication.  Return appointment recommended at routine intervals of 3 months  Patient has had laser treatments in the past- none recommended at today's visit

## 2013-07-20 ENCOUNTER — Ambulatory Visit (HOSPITAL_BASED_OUTPATIENT_CLINIC_OR_DEPARTMENT_OTHER): Payer: Medicare Other | Admitting: Oncology

## 2013-07-20 ENCOUNTER — Other Ambulatory Visit: Payer: Medicare Other

## 2013-07-20 VITALS — BP 103/60 | HR 76 | Temp 97.8°F | Resp 17 | Ht 63.0 in

## 2013-07-20 DIAGNOSIS — C20 Malignant neoplasm of rectum: Secondary | ICD-10-CM

## 2013-07-20 DIAGNOSIS — F0390 Unspecified dementia without behavioral disturbance: Secondary | ICD-10-CM

## 2013-07-20 NOTE — Progress Notes (Signed)
   [facility]    OFFICE PROGRESS NOTE   INTERVAL HISTORY:   She returns for followup of rectal cancer. She a flexible sigmoidoscopy Dr. Leone [name] on 01/27/2013. A sessile polyp was found in the anal verge. The pathology revealed a tubular adenoma. She will see Dr. Leone [name] in one year.  Sarah [name] has [name] complaint. She denies difficulty with bowel function. [name] bleeding. She recently fractured her right foot.  Objective:  Vital signs in last 24 hours:  Blood pressure 103/60, pulse 76, temperature 97.8 F (36.6 C), temperature source Oral, resp. rate 17, height 5\' 3"  (1.6 m), weight 0 lb (0 kg).    HEENT: Neck without mass Lymphatics: [name] cervical, supraclavicular, axillary, or inguinal nodes Resp: Coarse and inspiratory rhonchi at the left posterior base, [name] respiratory distress Cardio: Regular rate and rhythm GI: [name] hepatosplenomegaly, nontender, [name] mass Neuro: Alert, not oriented to place or situation     Lab Results:  CEA pending    Medications: I have reviewed the patient's current medications.  Assessment/Plan:  1. Rectal cancer (uT3,uN1) diagnosed in May of 2011 and treated with concurrent Xeloda and radiation   2. Flexible sigmoidoscopy on 01/04/2012 with "abnormal mucosa in the rectum "suspicious for persistent carcinoma-status post a biopsy revealing a fragment of tubulovillous adenoma and 2 fragments of benign colonic mucosa with prolapse changes  Repeat sigmoidoscopy June 2014, biopsy of rectal polyp revealed a tubular adenoma   3. Dementia -appears to be advanced   Disposition:  Sarah [name] has a history of rectal cancer. She has advanced dementia. She will return for followup at the [facility] as needed. She will continue clinical followup with Dr. Pete [name].   [name], MD  07/20/2013  1:23 PM

## 2013-07-21 ENCOUNTER — Telehealth: Payer: Self-pay | Admitting: *Deleted

## 2013-07-21 LAB — CEA: CEA: 1.1 ng/mL (ref 0.0–5.0)

## 2013-07-21 NOTE — Telephone Encounter (Signed)
Call from pt's daughter requesting lab results, update from office visit 12/18. She was unable to attend visit. Informed Dawn of normal CEA, pt to see Dr. Truett Perna as needed. No follow up has been scheduled in this office, clinical follow up per Dr. Pete Glatter. She voiced understanding.

## 2013-08-04 ENCOUNTER — Telehealth: Payer: Self-pay | Admitting: *Deleted

## 2013-08-04 NOTE — Telephone Encounter (Signed)
Pt's daughter called asking if toenail fungus treatments were complete. I reviewed pts chart and did see that she has had laser treatment for the nail fungus in the past, but none were recommended at [facility] visit. I called pt's daughter back and relayed this information to her. She states that she was now in a nursing facility and was hard to get her out and would probably just get the doctor there to trim her toenails.

## 2013-08-09 ENCOUNTER — Ambulatory Visit: Payer: Medicare Other | Admitting: Podiatrist

## 2013-08-30 ENCOUNTER — Ambulatory Visit
Admission: RE | Admit: 2013-08-30 | Discharge: 2013-08-30 | Disposition: A | Discharge status: Home / Self Care | Payer: Medicare Other | Source: Ambulatory Visit | Attending: Geriatric Medicine | Admitting: Geriatric Medicine

## 2013-08-30 ENCOUNTER — Other Ambulatory Visit: Payer: Self-pay | Admitting: Geriatric Medicine

## 2013-08-30 DIAGNOSIS — W19XXXA Unspecified fall, initial encounter: Secondary | ICD-10-CM

## 2013-10-13 ENCOUNTER — Inpatient Hospital Stay (HOSPITAL_COMMUNITY)
Admission: EM | Admit: 2013-10-13 | Discharge: 2013-10-15 | DRG: 394 | Disposition: A | Discharge status: Skilled Nursing Facility | Payer: Medicare Other | Attending: Internal Medicine | Admitting: Internal Medicine

## 2013-10-13 ENCOUNTER — Encounter (HOSPITAL_COMMUNITY): Payer: Self-pay | Admitting: Emergency Medicine

## 2013-10-13 ENCOUNTER — Emergency Department (HOSPITAL_COMMUNITY): Payer: Medicare Other

## 2013-10-13 DIAGNOSIS — K922 Gastrointestinal hemorrhage, unspecified: Secondary | ICD-10-CM | POA: Diagnosis present

## 2013-10-13 DIAGNOSIS — K5900 Constipation, unspecified: Secondary | ICD-10-CM | POA: Diagnosis present

## 2013-10-13 DIAGNOSIS — B49 Unspecified mycosis: Secondary | ICD-10-CM | POA: Diagnosis present

## 2013-10-13 DIAGNOSIS — M79676 Pain in unspecified toe(s): Secondary | ICD-10-CM

## 2013-10-13 DIAGNOSIS — Z8542 Personal history of malignant neoplasm of other parts of uterus: Secondary | ICD-10-CM

## 2013-10-13 DIAGNOSIS — E785 Hyperlipidemia, unspecified: Secondary | ICD-10-CM | POA: Diagnosis present

## 2013-10-13 DIAGNOSIS — M810 Age-related osteoporosis without current pathological fracture: Secondary | ICD-10-CM | POA: Diagnosis present

## 2013-10-13 DIAGNOSIS — Z66 Do not resuscitate: Secondary | ICD-10-CM | POA: Diagnosis present

## 2013-10-13 DIAGNOSIS — Z79899 Other long term (current) drug therapy: Secondary | ICD-10-CM

## 2013-10-13 DIAGNOSIS — S3660XA Unspecified injury of rectum, initial encounter: Principal | ICD-10-CM | POA: Diagnosis present

## 2013-10-13 DIAGNOSIS — B351 Tinea unguium: Secondary | ICD-10-CM

## 2013-10-13 DIAGNOSIS — N3289 Other specified disorders of bladder: Secondary | ICD-10-CM | POA: Diagnosis present

## 2013-10-13 DIAGNOSIS — Z9221 Personal history of antineoplastic chemotherapy: Secondary | ICD-10-CM

## 2013-10-13 DIAGNOSIS — Z87442 Personal history of urinary calculi: Secondary | ICD-10-CM

## 2013-10-13 DIAGNOSIS — E039 Hypothyroidism, unspecified: Secondary | ICD-10-CM | POA: Diagnosis present

## 2013-10-13 DIAGNOSIS — K921 Melena: Secondary | ICD-10-CM | POA: Diagnosis present

## 2013-10-13 DIAGNOSIS — I10 Essential (primary) hypertension: Secondary | ICD-10-CM | POA: Diagnosis present

## 2013-10-13 DIAGNOSIS — Z881 Allergy status to other antibiotic agents status: Secondary | ICD-10-CM

## 2013-10-13 DIAGNOSIS — K620 Anal polyp: Secondary | ICD-10-CM | POA: Diagnosis present

## 2013-10-13 DIAGNOSIS — K621 Rectal polyp: Secondary | ICD-10-CM

## 2013-10-13 DIAGNOSIS — Z923 Personal history of irradiation: Secondary | ICD-10-CM

## 2013-10-13 DIAGNOSIS — K449 Diaphragmatic hernia without obstruction or gangrene: Secondary | ICD-10-CM | POA: Diagnosis present

## 2013-10-13 DIAGNOSIS — Z8701 Personal history of pneumonia (recurrent): Secondary | ICD-10-CM

## 2013-10-13 DIAGNOSIS — F0390 Unspecified dementia without behavioral disturbance: Secondary | ICD-10-CM | POA: Diagnosis present

## 2013-10-13 DIAGNOSIS — I059 Rheumatic mitral valve disease, unspecified: Secondary | ICD-10-CM | POA: Diagnosis present

## 2013-10-13 DIAGNOSIS — D509 Iron deficiency anemia, unspecified: Secondary | ICD-10-CM | POA: Diagnosis present

## 2013-10-13 DIAGNOSIS — C20 Malignant neoplasm of rectum: Secondary | ICD-10-CM | POA: Diagnosis present

## 2013-10-13 DIAGNOSIS — X58XXXA Exposure to other specified factors, initial encounter: Secondary | ICD-10-CM

## 2013-10-13 DIAGNOSIS — Z85048 Personal history of other malignant neoplasm of rectum, rectosigmoid junction, and anus: Secondary | ICD-10-CM

## 2013-10-13 LAB — URINALYSIS, ROUTINE W REFLEX MICROSCOPIC
Bilirubin Urine: NEGATIVE
GLUCOSE, UA: NEGATIVE mg/dL
Hgb urine dipstick: NEGATIVE
Ketones, ur: NEGATIVE mg/dL
Leukocytes, UA: NEGATIVE
Nitrite: NEGATIVE
Protein, ur: NEGATIVE mg/dL
Specific Gravity, Urine: 1.016 (ref 1.005–1.030)
UROBILINOGEN UA: 0.2 mg/dL (ref 0.0–1.0)
pH: 7.5 (ref 5.0–8.0)

## 2013-10-13 LAB — COMPREHENSIVE METABOLIC PANEL
ALBUMIN: 3.5 g/dL (ref 3.5–5.2)
ALK PHOS: 86 U/L (ref 39–117)
ALT: 10 U/L (ref 0–35)
AST: 18 U/L (ref 0–37)
BUN: 22 mg/dL (ref 6–23)
CALCIUM: 10.9 mg/dL — AB (ref 8.4–10.5)
CO2: 31 mEq/L (ref 19–32)
Chloride: 102 mEq/L (ref 96–112)
Creatinine, Ser: 0.85 mg/dL (ref 0.50–1.10)
GFR calc Af Amer: 69 mL/min — ABNORMAL LOW (ref >90)
GFR calc non Af Amer: 60 mL/min — ABNORMAL LOW (ref >90)
Glucose, Bld: 90 mg/dL (ref 70–99)
Potassium: 3.9 mEq/L (ref 3.7–5.3)
SODIUM: 144 meq/L (ref 137–147)
TOTAL PROTEIN: 7.6 g/dL (ref 6.0–8.3)
Total Bilirubin: 0.3 mg/dL (ref 0.3–1.2)

## 2013-10-13 LAB — LIPASE, BLOOD: Lipase: 28 U/L (ref 11–59)

## 2013-10-13 LAB — CBC
HCT: 40.6 % (ref 36.0–46.0)
Hemoglobin: 12.8 g/dL (ref 12.0–15.0)
MCH: 31.8 pg (ref 26.0–34.0)
MCHC: 31.5 g/dL (ref 30.0–36.0)
MCV: 101 fL — ABNORMAL HIGH (ref 78.0–100.0)
Platelets: 294 10*3/uL (ref 150–400)
RBC: 4.02 MIL/uL (ref 3.87–5.11)
RDW: 13.4 % (ref 11.5–15.5)
WBC: 6.7 10*3/uL (ref 4.0–10.5)

## 2013-10-13 LAB — HEMOGLOBIN AND HEMATOCRIT, BLOOD
HCT: 36.8 % (ref 36.0–46.0)
HCT: 36.9 % (ref 36.0–46.0)
Hemoglobin: 11.7 g/dL — ABNORMAL LOW (ref 12.0–15.0)
Hemoglobin: 11.8 g/dL — ABNORMAL LOW (ref 12.0–15.0)

## 2013-10-13 LAB — PROTIME-INR
INR: 0.85 (ref 0.00–1.49)
PROTHROMBIN TIME: 11.5 s — AB (ref 11.6–15.2)

## 2013-10-13 LAB — ABO/RH: ABO/RH(D): O POS

## 2013-10-13 LAB — POC OCCULT BLOOD, ED: FECAL OCCULT BLD: POSITIVE — AB

## 2013-10-13 LAB — TYPE AND SCREEN
ABO/RH(D): O POS
Antibody Screen: NEGATIVE

## 2013-10-13 MED ORDER — SODIUM CHLORIDE 0.9 % IV SOLN
80.0000 mg | Freq: Once | INTRAVENOUS | Status: AC
  Administered 2013-10-13: 80 mg via INTRAVENOUS
  Filled 2013-10-13: qty 80

## 2013-10-13 MED ORDER — CITALOPRAM HYDROBROMIDE 20 MG PO TABS
20.0000 mg | ORAL_TABLET | Freq: Every day | ORAL | Status: DC
  Administered 2013-10-13 – 2013-10-15 (×3): 20 mg via ORAL
  Filled 2013-10-13 (×3): qty 1

## 2013-10-13 MED ORDER — SODIUM CHLORIDE 0.9 % IV SOLN
8.0000 mg/h | INTRAVENOUS | Status: DC
  Administered 2013-10-13 – 2013-10-14 (×2): 8 mg/h via INTRAVENOUS
  Filled 2013-10-13 (×4): qty 80

## 2013-10-13 MED ORDER — PANTOPRAZOLE SODIUM 40 MG IV SOLR
40.0000 mg | Freq: Two times a day (BID) | INTRAVENOUS | Status: DC
  Administered 2013-10-13: 40 mg via INTRAVENOUS
  Filled 2013-10-13 (×2): qty 40

## 2013-10-13 MED ORDER — POTASSIUM CL IN DEXTROSE 5% 20 MEQ/L IV SOLN
20.0000 meq | INTRAVENOUS | Status: DC
  Administered 2013-10-13 – 2013-10-14 (×2): 20 meq via INTRAVENOUS
  Filled 2013-10-13 (×2): qty 1000

## 2013-10-13 MED ORDER — LEVOTHYROXINE SODIUM 50 MCG PO TABS
50.0000 ug | ORAL_TABLET | Freq: Every day | ORAL | Status: DC
  Administered 2013-10-13 – 2013-10-15 (×3): 50 ug via ORAL
  Filled 2013-10-13 (×4): qty 1

## 2013-10-13 MED ORDER — HYDROCODONE-ACETAMINOPHEN 5-325 MG PO TABS: 1.0000 | ORAL_TABLET | ORAL | Status: DC | PRN

## 2013-10-13 MED ORDER — POLYETHYLENE GLYCOL 3350 17 G PO PACK
17.0000 g | PACK | Freq: Every day | ORAL | Status: DC
  Administered 2013-10-13: 17 g via ORAL
  Filled 2013-10-13 (×2): qty 1

## 2013-10-13 MED ORDER — RIVASTIGMINE 9.5 MG/24HR TD PT24
9.5000 mg | MEDICATED_PATCH | Freq: Every day | TRANSDERMAL | Status: DC
  Administered 2013-10-13 – 2013-10-15 (×3): 9.5 mg via TRANSDERMAL
  Filled 2013-10-13 (×3): qty 1

## 2013-10-13 MED ORDER — ONDANSETRON HCL 4 MG/2ML IJ SOLN: 4.0000 mg | Freq: Four times a day (QID) | INTRAMUSCULAR | Status: DC | PRN

## 2013-10-13 MED ORDER — HYDRALAZINE HCL 20 MG/ML IJ SOLN
10.0000 mg | Freq: Four times a day (QID) | INTRAMUSCULAR | Status: DC | PRN
  Filled 2013-10-13: qty 0.5

## 2013-10-13 MED ORDER — TAMSULOSIN HCL 0.4 MG PO CAPS
0.4000 mg | ORAL_CAPSULE | Freq: Every day | ORAL | Status: DC
  Administered 2013-10-13 – 2013-10-15 (×3): 0.4 mg via ORAL
  Filled 2013-10-13 (×3): qty 1

## 2013-10-13 MED ORDER — IOHEXOL 300 MG/ML  SOLN
100.0000 mL | Freq: Once | INTRAMUSCULAR | Status: AC | PRN
  Administered 2013-10-13: 100 mL via INTRAVENOUS

## 2013-10-13 MED ORDER — GUAIFENESIN-DM 100-10 MG/5ML PO SYRP
5.0000 mL | ORAL_SOLUTION | ORAL | Status: DC | PRN
  Administered 2013-10-14: 5 mL via ORAL
  Filled 2013-10-13: qty 10

## 2013-10-13 MED ORDER — ONDANSETRON HCL 4 MG PO TABS: 4.0000 mg | ORAL_TABLET | Freq: Four times a day (QID) | ORAL | Status: DC | PRN

## 2013-10-13 MED ORDER — SORBITOL 70 % SOLN
960.0000 mL | TOPICAL_OIL | Freq: Once | ORAL | Status: AC
  Administered 2013-10-13: 960 mL via RECTAL
  Filled 2013-10-13: qty 240

## 2013-10-13 MED ORDER — MEMANTINE HCL ER 28 MG PO CP24
28.0000 mg | ORAL_CAPSULE | Freq: Every day | ORAL | Status: DC
  Administered 2013-10-13 – 2013-10-15 (×3): 28 mg via ORAL
  Filled 2013-10-13 (×3): qty 28

## 2013-10-13 MED ORDER — IOHEXOL 300 MG/ML  SOLN
50.0000 mL | Freq: Once | INTRAMUSCULAR | Status: AC | PRN
  Administered 2013-10-13: 50 mL via ORAL

## 2013-10-13 MED ORDER — PANTOPRAZOLE SODIUM 40 MG IV SOLR: 40.0000 mg | Freq: Two times a day (BID) | INTRAVENOUS | Status: DC

## 2013-10-13 NOTE — Progress Notes (Signed)
Clinical Social Work Department BRIEF PSYCHOSOCIAL ASSESSMENT 10/13/2013  Patient:  Hoppel,Dayrin     Account Number:  [id number]     Admit date:  10/13/2013  Clinical Social Worker:  Renold Genta  Date/Time:  10/13/2013 03:53 PM  Referred by:  Physician  Date Referred:  10/13/2013 Referred for  Other - See comment   Other Referral:   Admitted from: [facility]   Interview type:  Family Other interview type:   patient's daughter/POA, Dawn via phone    PSYCHOSOCIAL DATA Living Status:  FACILITY Admitted from facility:  [facility] Level of care:  Assisted Living Primary support name:  Ursula Beath (daughter/HCPOA) lives in [place] c#: [phone] Primary support relationship to patient:  CHILD, ADULT Degree of support available:   good    CURRENT CONCERNS Current Concerns  Post-Acute Placement   Other Concerns:    SOCIAL WORK ASSESSMENT / PLAN CSW received consult that patient was admitted from [facility].   Assessment/plan status:  Information/Referral to [employer] Other assessment/ plan:   Information/referral to community resources:   CSW completed FL2 and confirmed with Claiborne Billings from [facility] that patient is from the memory care unit/ALF and should be able to return there if still at baseline, otherwise they could take her to their SNF if needed.    PATIENT'S/FAMILY'S RESPONSE TO PLAN OF CARE: Patient's family is pleased with [facility] and are agreeable with her returning when stable.       Raynaldo Opitz, [facility] Clinical Social Worker cell #: [phone]

## 2013-10-13 NOTE — H&P (Addendum)
Patient Demographics  Sarah [name], is a 80 y.o. female  MRN: [id number]   DOB - July 24, 1924  Admit Date - 10/13/2013  Outpatient Primary MD for the patient is [name], MD   With History of -  Past Medical History  Diagnosis Date  . Rectal cancer 12/2009    T3N1, chemoradiation therapy  . Hypothyroidism   . Hyperlipidemia   . Uterine cancer 1990's  . Constipation   . Diverticulosis     descending colon & sigmoid colon  . History of kidney stones   . History of pneumonia   . Osteoporosis   . Closed fracture of dorsal (thoracic) vertebra without mention of spinal cord injury   . Closed fracture of lumbar vertebra without mention of spinal cord injury   . Mitral valve disorders   . Mycotic toenails 02/02/2013      Past Surgical History  Procedure Laterality Date  . Colonoscopy  12/2009  . Spinal injection    . Flexible sigmoidoscopy    . Cataracts    . Abdominal hysterectomy  1996    b/c uterine cancer  . Hammer toe surgery  2008, 2010 & 2011  . Eye surgery Bilateral     Cataract surgery    in for   Chief Complaint  Patient presents with  . Rectal Bleeding     HPI  Sarah [name]  is a 80 y.o. female, with H/O dementia , rectal CA T3N1 post radiation-chemo followed by Dr. Blair Promise, now being followed by Dr. Carlean Purl for rectal polyp, hypothyroidism, dyslipidemia, hypertension who lives in a nursing home and was brought in today for maroon-colored stools, patient is pleasantly demented with no subjective complaints whatsoever. She does not remember passing any dark-colored a maroon stools. She is completely symptom-free denies any headache chest pain, no abdominal pain cramping, no nausea vomiting. CT scan of the abdomen pelvis showed large stool burden but no acute changes. She seems medically  stable with stable labs, I was called to admit the patient for GI bleed.    Review of Systems  Negative ROS  In addition to the HPI above,   No Fever-chills, No Headache, No changes with Vision or hearing, No problems swallowing food or Liquids, No Chest pain, Cough or Shortness of Breath, No Abdominal pain, No Nausea or Vommitting, Bowel movements are regular, No Blood in stool or Urine, No dysuria, No new skin rashes or bruises, No new joints pains-aches,  No new weakness, tingling, numbness in any extremity, No recent weight gain or loss, No polyuria, polydypsia or polyphagia, No significant Mental Stressors.  A full 10 point Review of Systems was done, except as stated above, all other Review of Systems were negative.   Social History History  Substance Use Topics  . Smoking status: Never Smoker   . Smokeless tobacco: Never Used  . Alcohol Use: No      Family History Family History  Problem Relation Age of Onset  .  Heart disease Father   . Aneurysm Father     AAA  . Coronary artery disease Mother   . Stroke Mother   . Alzheimer's disease Brother   . Muscular dystrophy Sister   . Heart attack Son   . Muscular dystrophy Sister   . Heart disease Brother   . Muscular dystrophy Sister   . Colon polyps Neg Hx   . Esophageal cancer Neg Hx   . Stomach cancer Neg Hx   . Rectal cancer Neg Hx       Prior to Admission medications   Medication Sig Start Date End Date Taking? Authorizing Provider  acetaminophen (TYLENOL) 500 MG tablet Take 1,000 mg by mouth every 4 (four) hours as needed for mild pain or fever.   Yes Historical Provider, MD  bisacodyl (DULCOLAX) 10 MG suppository Place 10 mg rectally 2 (two) times daily as needed for mild constipation or moderate constipation.   Yes Historical Provider, MD  Calcium Carb-Cholecalciferol (CALCIUM-VITAMIN D) 600-400 MG-UNIT TABS Take 1 tablet by mouth 2 (two) times daily.   Yes Historical Provider, MD  Cholecalciferol  1000 UNITS TBDP Take 1 tablet by mouth daily.   Yes Historical Provider, MD  citalopram (CELEXA) 20 MG tablet Take 20 mg by mouth daily.  06/23/13  Yes Historical Provider, MD  diclofenac (FLECTOR) 1.3 % PTCH Place 1 patch onto the skin daily. Apply  At 8am and take off at 8pm   Yes Historical Provider, MD  docusate (COLACE) 50 MG/5ML liquid Take 100 mg by mouth daily.   Yes Historical Provider, MD  docusate (COLACE) 50 MG/5ML liquid Take 200 mg by mouth daily as needed for mild constipation.   Yes Historical Provider, MD  ENSURE PLUS (ENSURE PLUS) LIQD Take 0.5 Cans by mouth 2 (two) times daily.   Yes Historical Provider, MD  levothyroxine (SYNTHROID, LEVOTHROID) 50 MCG tablet Take 50 mcg by mouth daily.   Yes Historical Provider, MD  Memantine HCl ER (NAMENDA XR) 28 MG CP24 Take 28 mg by mouth daily.   Yes Historical Provider, MD  Multiple Vitamins-Minerals (CERTAVITE/ANTIOXIDANTS PO) Take 1 tablet by mouth daily.   Yes Historical Provider, MD  Pollen Extracts (PROSTAT PO) Take 30 mLs by mouth 2 (two) times daily.   Yes Historical Provider, MD  polyethylene glycol powder (GLYCOLAX/MIRALAX) powder Take 17 g by mouth daily.  10/11/12  Yes Historical Provider, MD  rivastigmine (EXELON) 9.5 mg/24hr Place 1 patch onto the skin daily.   Yes Historical Provider, MD  senna (SENOKOT) 8.6 MG tablet Take 1 tablet by mouth at bedtime.   Yes Historical Provider, MD    Allergies  Allergen Reactions  . Sulfa Antibiotics     Unsure on [facility]    Physical Exam  Vitals  Blood pressure 180/79, pulse 67, temperature 98.3 F (36.8 C), temperature source Oral, resp. rate 20, SpO2 96.00%.   1. General demented elderly white female lying in bed in NAD,     2. Normal affect and insight, Not Suicidal or Homicidal, Awake Alert, Oriented X 3.  3. No F.N deficits, ALL C.Nerves Intact, Strength 5/5 all 4 extremities, Sensation intact all 4 extremities, Plantars down going.  4. Ears and Eyes appear Normal,  Conjunctivae clear, PERRLA. Moist Oral Mucosa.  5. Supple Neck, No JVD, No cervical lymphadenopathy appriciated, No Carotid Bruits.  6. Symmetrical Chest wall movement, Good air movement bilaterally, CTAB.  7. RRR, No Gallops, Rubs or Murmurs, No Parasternal Heave.  8. Positive Bowel Sounds, Abdomen Soft,  Non tender, No organomegaly appriciated,No rebound -guarding or rigidity.  9.  No Cyanosis, Normal Skin Turgor, No Skin Rash or Bruise.  10. Good muscle tone,  joints appear normal , no effusions, Normal ROM.  11. No Palpable Lymph Nodes in Neck or Axillae    Data Review  CBC  Recent Labs Lab 10/13/13 0755  WBC 6.7  HGB 12.8  HCT 40.6  PLT 294  MCV 101.0*  MCH 31.8  MCHC 31.5  RDW 13.4   ------------------------------------------------------------------------------------------------------------------  Chemistries   Recent Labs Lab 10/13/13 0755  NA 144  K 3.9  CL 102  CO2 31  GLUCOSE 90  BUN 22  CREATININE 0.85  CALCIUM 10.9*  AST 18  ALT 10  ALKPHOS 86  BILITOT 0.3   ------------------------------------------------------------------------------------------------------------------ CrCl is unknown because both a height and weight (above a minimum accepted value) are required for this calculation. ------------------------------------------------------------------------------------------------------------------ No results found for this basename: TSH, T4TOTAL, FREET3, T3FREE, THYROIDAB,  in the last 72 hours   Coagulation profile  Recent Labs Lab 10/13/13 0755  INR 0.85   ------------------------------------------------------------------------------------------------------------------- No results found for this basename: DDIMER,  in the last 72 hours -------------------------------------------------------------------------------------------------------------------  Cardiac Enzymes No results found for this basename: CK, CKMB, TROPONINI, MYOGLOBIN,   in the last 168 hours ------------------------------------------------------------------------------------------------------------------ No components found with this basename: POCBNP,    ---------------------------------------------------------------------------------------------------------------  Urinalysis    Component Value Date/Time   COLORURINE YELLOW 10/13/2013 0921   APPEARANCEUR CLOUDY* 10/13/2013 0921   LABSPEC 1.016 10/13/2013 0921   PHURINE 7.5 10/13/2013 [place] 10/13/2013 0921   HGBUR NEGATIVE 10/13/2013 [place] 10/13/2013 0921   KETONESUR NEGATIVE 10/13/2013 0921   PROTEINUR NEGATIVE 10/13/2013 0921   UROBILINOGEN 0.2 10/13/2013 0921   NITRITE NEGATIVE 10/13/2013 0921   LEUKOCYTESUR NEGATIVE 10/13/2013 0921    ----------------------------------------------------------------------------------------------------------------  Imaging results:   Ct Abdomen Pelvis W Contrast  10/13/2013   CLINICAL DATA:  Bloody stools, history of rectal malignancy now with mildly distended abdomen  EXAM: CT ABDOMEN AND PELVIS WITH CONTRAST  TECHNIQUE: Multidetector CT imaging of the abdomen and pelvis was performed using the standard protocol following bolus administration of intravenous contrast.  CONTRAST:  153mL OMNIPAQUE IOHEXOL 300 MG/ML SOLN ; the patient also received oral contrast material.  COMPARISON:  DG ABDOMEN 1V dated 07/18/2012  FINDINGS: The orally administration. Contrast has reached the right colon. There is considerable stool burden throughout the colon without signs of obstruction. No discrete rectum or perirectal mass is demonstrated. No pelvic lymphadenopathy is demonstrated.  The small bowel exhibits no evidence of enteritis nor obstruction or ileus. The terminal ileum is normal in appearance. There is a normal caliber to uninflamed appearing appendix demonstrated on images 42 through 49.  There is a large hiatal hernia -partially intrathoracic  stomach. The duodenum exhibits no inflammatory change. The liver, gallbladder, pancreas, spleen, adrenal glands, and kidneys exhibit no acute abnormalities. There is a 1.8 cm diameter hypodensity exophytic from the posterior aspect of the midpole of the left kidney with HU measurement of +20 compatible with a cyst. Along the course of the ureters no stones are evident. The urinary bladder is distended. The uterus is surgically absent. There are no adnexal masses. The caliber of the abdominal aorta is normal. There is no periaortic or pericaval lymphadenopathy. There is no evidence of ascites.  There is compression of the bodies of T12 and L2 with kyphoplasty having been performed at T12. No large retropulsed bony fragments are demonstrated.  The bony pelvis exhibits no acute abnormality. The lung bases exhibit no evidence of pneumonia. Fibrotic changes are present.  IMPRESSION: 1. There is a large stool burden throughout the colon without evidence of ileus nor of obstruction. There are no suspicious colonic masses, and there is no evidence of acute diverticulitis. The perirectal soft tissues exhibit no acute abnormalities. 2. There is no evidence of urinary tract obstruction. The urinary bladder is mildly distended however. 3. There is no acute hepatobiliary abnormality. 4. There is a large hiatal hernia-partially intrathoracic stomach. No acute abnormality of the stomach or duodenum or remainder of the small bowel is demonstrated. 5. No pathologic sized intraperitoneal or retroperitoneal lymph nodes are demonstrated. There is no evidence of ascites.   Electronically Signed   By: David  Martinique   On: 10/13/2013 10:05    My personal review of EKG: Rhythm NSR, Rate  66 /min,   no Acute ST changes    Assessment & Plan    1. Maroon-colored stools. Stable BUN, stable H&H, no nausea vomiting abdominal discomfort whatsoever, does have history of rectal cancer and rectal polyp. CT shows evidence of large stool  burden, question if she has internal rectal tear due to constipation, however upper lower GI bleed by itself cannot be ruled out. Will be admitted to a MedSurg bed, type screen, monitor H&H, IV PPI bolus with drip. Keep her n.p.o. except fluids for now. GI has been consulted.     2. History of rectal cancer and now rectal polyp. GI consulted. She status post chemotherapy radiation several years ago, rectal cancer itself thought to be in remission.     3. Dementia. At risk for delirium, home dementia medications will be continued. Have updated daughter personally over the phone.     4. Hypertension. When necessary IV hydralazine for now.      5.Mild Hypercalcemia - hydratae, repeat in am.     6.Bladder distension - foley- flomax.      DVT Prophylaxis   SCDs    AM Labs Ordered, also please review Full Orders  Family Communication: Admission, patients condition and plan of care including tests being ordered have been discussed with the patient and daughter who indicate understanding and agree with the plan and Code Status.  Code Status DNR  Likely DC to    Condition GUARDED    Time spent in minutes :35    Sinclair Arrazola K M.D on 10/13/2013 at 10:58 AM  Between 7am to 7pm - Pager - [phone]  After 7pm go to [email or web address] - password [email or web address]  And look for the night coverage person covering me after hours  [facility] Office  [phone]

## 2013-10-13 NOTE — ED Provider Notes (Signed)
CSN: [id number]     Arrival date & time 10/13/13  0735 History   First MD Initiated Contact with Patient 10/13/13 [phone]     Chief Complaint  Patient presents with  . Rectal Bleeding     (Consider location/radiation/quality/duration/timing/severity/associated sxs/prior Treatment) Patient is a 80 y.o. female presenting with hematochezia. The history is provided by the patient.  Rectal Bleeding Quality: dark appearing. Amount:  Unable to specify Timing:  Constant Progression:  Unchanged Chronicity:  New Context comment:  Hx of rectal cancer Similar prior episodes: no   Relieved by:  Nothing Worsened by:  Nothing tried Ineffective treatments:  None tried Associated symptoms: no abdominal pain, no dizziness, no fever and no vomiting     Past Medical History  Diagnosis Date  . Rectal cancer 12/2009    T3N1, chemoradiation therapy  . Hypothyroidism   . Hyperlipidemia   . Uterine cancer 1990's  . Constipation   . Diverticulosis     descending colon & sigmoid colon  . History of kidney stones   . History of pneumonia   . Osteoporosis   . Closed fracture of dorsal (thoracic) vertebra without mention of spinal cord injury   . Closed fracture of lumbar vertebra without mention of spinal cord injury   . Mitral valve disorders   . Mycotic toenails 02/02/2013   Past Surgical History  Procedure Laterality Date  . Colonoscopy  12/2009  . Spinal injection    . Flexible sigmoidoscopy    . Cataracts    . Abdominal hysterectomy  1996    b/c uterine cancer  . Hammer toe surgery  2008, 2010 & 2011  . Eye surgery Bilateral     Cataract surgery   Family History  Problem Relation Age of Onset  . Heart disease Father   . Aneurysm Father     AAA  . Coronary artery disease Mother   . Stroke Mother   . Alzheimer's disease Brother   . Muscular dystrophy Sister   . Heart attack Son   . Muscular dystrophy Sister   . Heart disease Brother   . Muscular dystrophy Sister   . Colon polyps  Neg Hx   . Esophageal cancer Neg Hx   . Stomach cancer Neg Hx   . Rectal cancer Neg Hx    History  Substance Use Topics  . Smoking status: Never Smoker   . Smokeless tobacco: Never Used  . Alcohol Use: No   OB History   Grav Para Term Preterm Abortions TAB SAB Ect Mult Living                 Review of Systems  Constitutional: Negative for fever and fatigue.  HENT: Negative for congestion and drooling.   Eyes: Negative for pain.  Respiratory: Negative for cough and shortness of breath.   Cardiovascular: Negative for chest pain.  Gastrointestinal: Positive for hematochezia. Negative for nausea, vomiting, abdominal pain and diarrhea.  Genitourinary: Negative for dysuria and hematuria.  Musculoskeletal: Negative for back pain, gait problem and neck pain.  Skin: Negative for color change.  Neurological: Negative for dizziness and headaches.  Hematological: Negative for adenopathy.  Psychiatric/Behavioral: Negative for behavioral problems.  All other systems reviewed and are negative.      Allergies  Sulfa antibiotics  Home Medications   Current Outpatient Rx  Name  Route  Sig  Dispense  Refill  . acetaminophen (TYLENOL) 500 MG tablet   Oral   Take 1,000 mg by mouth every 4 (four)  hours as needed for mild pain or fever.         . bisacodyl (DULCOLAX) 10 MG suppository   Rectal   Place 10 mg rectally 2 (two) times daily as needed for mild constipation or moderate constipation.         . Calcium Carb-Cholecalciferol (CALCIUM-VITAMIN D) 600-400 MG-UNIT TABS   Oral   Take 1 tablet by mouth 2 (two) times daily.         . Cholecalciferol 1000 UNITS TBDP   Oral   Take 1 tablet by mouth daily.         . citalopram (CELEXA) 20 MG tablet   Oral   Take 20 mg by mouth daily.          . diclofenac (FLECTOR) 1.3 % PTCH   Transdermal   Place 1 patch onto the skin daily. Apply  At 8am and take off at 8pm         . docusate (COLACE) 50 MG/5ML liquid   Oral    Take 100 mg by mouth daily.         Marland Kitchen docusate (COLACE) 50 MG/5ML liquid   Oral   Take 200 mg by mouth daily as needed for mild constipation.         . ENSURE PLUS (ENSURE PLUS) LIQD   Oral   Take 0.5 Cans by mouth 2 (two) times daily.         Marland Kitchen levothyroxine (SYNTHROID, LEVOTHROID) 50 MCG tablet   Oral   Take 50 mcg by mouth daily.         . Memantine HCl ER (NAMENDA XR) 28 MG CP24   Oral   Take 28 mg by mouth daily.         . Multiple Vitamins-Minerals (CERTAVITE/ANTIOXIDANTS PO)   Oral   Take 1 tablet by mouth daily.         . Pollen Extracts (PROSTAT PO)   Oral   Take 30 mLs by mouth 2 (two) times daily.         . polyethylene glycol powder (GLYCOLAX/MIRALAX) powder   Oral   Take 17 g by mouth daily.          . rivastigmine (EXELON) 9.5 mg/24hr   Transdermal   Place 1 patch onto the skin daily.         Marland Kitchen senna (SENOKOT) 8.6 MG tablet   Oral   Take 1 tablet by mouth at bedtime.          BP 181/87  Pulse 71  Temp(Src) 98.3 F (36.8 C) (Oral)  Resp 14  SpO2 93% Physical Exam  Nursing note and vitals reviewed. Constitutional: She is oriented to person, place, and time. She appears well-developed and well-nourished.  HENT:  Head: Normocephalic.  Mouth/Throat: Oropharynx is clear and moist. No oropharyngeal exudate.  Eyes: Conjunctivae and EOM are normal. Pupils are equal, round, and reactive to light.  Neck: Normal range of motion. Neck supple.  Cardiovascular: Normal rate, regular rhythm, normal heart sounds and intact distal pulses.  Exam reveals no gallop and no friction rub.   No murmur heard. Pulmonary/Chest: Effort normal and breath sounds normal. No respiratory distress. She has no wheezes.  Abdominal: Soft. Bowel sounds are normal. She exhibits distension (mild). There is no tenderness. There is no rebound and no guarding.  Genitourinary:  Normal appearing external rectum.  Maroon colored stool.   Musculoskeletal: Normal range of  motion. She exhibits no edema and no tenderness.  Neurological: She is alert and oriented to person, place, and time.  Skin: Skin is warm and dry.  Psychiatric: She has a normal mood and affect. Her behavior is normal.    ED Course  Procedures (including critical care time) Labs Review Labs Reviewed  CBC - Abnormal; Notable for the following:    MCV 101.0 (*)    All other components within normal limits  COMPREHENSIVE METABOLIC PANEL - Abnormal; Notable for the following:    Calcium 10.9 (*)    GFR calc non Af Amer 60 (*)    GFR calc Af Amer 69 (*)    All other components within normal limits  PROTIME-INR - Abnormal; Notable for the following:    Prothrombin Time 11.5 (*)    All other components within normal limits  URINALYSIS, ROUTINE W REFLEX MICROSCOPIC - Abnormal; Notable for the following:    APPearance CLOUDY (*)    All other components within normal limits  POC OCCULT BLOOD, ED - Abnormal; Notable for the following:    Fecal Occult Bld POSITIVE (*)    All other components within normal limits  LIPASE, BLOOD  HEMOGLOBIN AND HEMATOCRIT, BLOOD  HEMOGLOBIN AND HEMATOCRIT, BLOOD  TYPE AND SCREEN   Imaging Review Ct Abdomen Pelvis W Contrast  10/13/2013   CLINICAL DATA:  Bloody stools, history of rectal malignancy now with mildly distended abdomen  EXAM: CT ABDOMEN AND PELVIS WITH CONTRAST  TECHNIQUE: Multidetector CT imaging of the abdomen and pelvis was performed using the standard protocol following bolus administration of intravenous contrast.  CONTRAST:  159mL OMNIPAQUE IOHEXOL 300 MG/ML SOLN ; the patient also received oral contrast material.  COMPARISON:  DG ABDOMEN 1V dated 07/18/2012  FINDINGS: The orally administration. Contrast has reached the right colon. There is considerable stool burden throughout the colon without signs of obstruction. No discrete rectum or perirectal mass is demonstrated. No pelvic lymphadenopathy is demonstrated.  The small bowel exhibits no  evidence of enteritis nor obstruction or ileus. The terminal ileum is normal in appearance. There is a normal caliber to uninflamed appearing appendix demonstrated on images 42 through 49.  There is a large hiatal hernia -partially intrathoracic stomach. The duodenum exhibits no inflammatory change. The liver, gallbladder, pancreas, spleen, adrenal glands, and kidneys exhibit no acute abnormalities. There is a 1.8 cm diameter hypodensity exophytic from the posterior aspect of the midpole of the left kidney with HU measurement of +20 compatible with a cyst. Along the course of the ureters no stones are evident. The urinary bladder is distended. The uterus is surgically absent. There are no adnexal masses. The caliber of the abdominal aorta is normal. There is no periaortic or pericaval lymphadenopathy. There is no evidence of ascites.  There is compression of the bodies of T12 and L2 with kyphoplasty having been performed at T12. No large retropulsed bony fragments are demonstrated. The bony pelvis exhibits no acute abnormality. The lung bases exhibit no evidence of pneumonia. Fibrotic changes are present.  IMPRESSION: 1. There is a large stool burden throughout the colon without evidence of ileus nor of obstruction. There are no suspicious colonic masses, and there is no evidence of acute diverticulitis. The perirectal soft tissues exhibit no acute abnormalities. 2. There is no evidence of urinary tract obstruction. The urinary bladder is mildly distended however. 3. There is no acute hepatobiliary abnormality. 4. There is a large hiatal hernia-partially intrathoracic stomach. No acute abnormality of the stomach or duodenum or remainder of the small bowel is demonstrated. 5.  No pathologic sized intraperitoneal or retroperitoneal lymph nodes are demonstrated. There is no evidence of ascites.   Electronically Signed   By: David  Martinique   On: 10/13/2013 10:05     EKG Interpretation   Date/Time:  Friday October 13 2013 08:31:19 EDT Ventricular Rate:  66 PR Interval:  190 QRS Duration: 93 QT Interval:  423 QTC Calculation: 443 R Axis:   -21 Text Interpretation:  Sinus rhythm Borderline left axis deviation Low  voltage, precordial leads Consider anterior infarct Baseline wander in  lead(s) I aVR Confirmed by Jessenia Filippone  MD, Zavia Pullen ([id number]) on 10/13/2013  8:36:08 AM      MDM   Final diagnoses:  GI bleed    8:19 AM 80 y.o. female with a history of rectal cancer who presents with report of dark stools recently. She denies any pain, fever, vomiting, or diarrhea. She is afebrile and vital signs are unremarkable here. She is alert and oriented x2. Her abdomen appears mildly distended but she denies any pain in her abdomen is soft. Normal rectal exam with maroon colored stool. Will get screening labwork and CT scan of abdomen given history of rectal cancer and abdominal distention.  Discussed case w/ on call for  GI. They will see the pt.   The patient will be admitted to [facility]. Any medications given in the ED during this visit are listed below:  Medications  pantoprazole (PROTONIX) 80 mg in sodium chloride 0.9 % 100 mL IVPB (not administered)  pantoprazole (PROTONIX) 80 mg in sodium chloride 0.9 % 250 mL infusion (not administered)  pantoprazole (PROTONIX) injection 40 mg (not administered)  iohexol (OMNIPAQUE) 300 MG/ML solution 50 mL (50 mLs Oral Contrast Given 10/13/13 0828)  iohexol (OMNIPAQUE) 300 MG/ML solution 100 mL (100 mLs Intravenous Contrast Given 10/13/13 0941)        Blanchard Kelch, MD 10/13/13 1057

## 2013-10-13 NOTE — Progress Notes (Signed)
Pt given SMOG enema. Moderate amount of Joandry Slagter maroon-colored soft formed stool returned.  Pt tolerated well. Stacey Drain

## 2013-10-13 NOTE — ED Notes (Signed)
Per EMS- Patient is a resident of [facility]. Patient has had several stools with dark colored blood present. Patient has a history of rectal cancer. Slightly distended abdomen, positive BS.

## 2013-10-13 NOTE — ED Notes (Signed)
Bed: SF42 Expected date:  Expected time:  Means of arrival:  Comments: EMS-blood in stool

## 2013-10-13 NOTE — Progress Notes (Signed)
16 french foley catheter was inserted after 2 attempts.  Urine return was clear and yellow.  Bag was dated and time stamped, and catheter was secured in place.

## 2013-10-13 NOTE — ED Notes (Signed)
Attempted to call report, but receiving nurse with a patient and is to return call.

## 2013-10-13 NOTE — Progress Notes (Signed)
UR completed 

## 2013-10-13 NOTE — ED Notes (Signed)
Patient transported to CT 

## 2013-10-13 NOTE — Consult Note (Signed)
Consultation  Referring Provider:[facility] (Dr. Candiss Norse)     Primary Care Physician:  [name], MD Primary Gastroenterologist:   Silvano Rusk, MD     Reason for Consultation:  GI bleed            HPI:   Sarah [name] is a 80 y.o. female with a history of rectal cancer in 2011 (uT3,uN1)  treated with Xeloda and radiation. Her last colonoscopy done June 2014 revealed a 70mm rectal polyp (tubular adenoma)  Patient brought to ED from [facility] today for evaluation of GI bleeding. Patient has dementia, cannot provide reliable history so detail come from chart and son. Patient hemodynamically stable. Hgb 12.8, WBC 6.7.  Abdomen was distended so contrast CTscan obtained with findings of a large stool burden throughout colon and a large hiatal hernia, partially intrathoracic stomach. No NSAIDs on home med list.  BUN normal.    Past Medical History  Diagnosis Date  . Rectal cancer 12/2009    T3N1, chemoradiation therapy  . Hypothyroidism   . Hyperlipidemia   . Uterine cancer 1990's  . Constipation   . Diverticulosis     descending colon & sigmoid colon  . History of kidney stones   . History of pneumonia   . Osteoporosis   . Closed fracture of dorsal (thoracic) vertebra without mention of spinal cord injury   . Closed fracture of lumbar vertebra without mention of spinal cord injury   . Mitral valve disorders   . Mycotic toenails 02/02/2013    Past Surgical History  Procedure Laterality Date  . Colonoscopy  12/2009  . Spinal injection    . Flexible sigmoidoscopy    . Cataracts    . Abdominal hysterectomy  1996    b/c uterine cancer  . Hammer toe surgery  2008, 2010 & 2011  . Eye surgery Bilateral     Cataract surgery    Family History  Problem Relation Age of Onset  . Heart disease Father   . Aneurysm Father     AAA  . Coronary artery disease Mother   . Stroke Mother   . Alzheimer's disease Brother   . Muscular dystrophy Sister   . Heart attack Son     . Muscular dystrophy Sister   . Heart disease Brother   . Muscular dystrophy Sister   . Colon polyps Neg Hx   . Esophageal cancer Neg Hx   . Stomach cancer Neg Hx   . Rectal cancer Neg Hx      History  Substance Use Topics  . Smoking status: Never Smoker   . Smokeless tobacco: Never Used  . Alcohol Use: No    Prior to Admission medications   Medication Sig Start Date End Date Taking? Authorizing Provider  acetaminophen (TYLENOL) 500 MG tablet Take 1,000 mg by mouth every 4 (four) hours as needed for mild pain or fever.   Yes Historical Provider, MD  bisacodyl (DULCOLAX) 10 MG suppository Place 10 mg rectally 2 (two) times daily as needed for mild constipation or moderate constipation.   Yes Historical Provider, MD  Calcium Carb-Cholecalciferol (CALCIUM-VITAMIN D) 600-400 MG-UNIT TABS Take 1 tablet by mouth 2 (two) times daily.   Yes Historical Provider, MD  Cholecalciferol 1000 UNITS TBDP Take 1 tablet by mouth daily.   Yes Historical Provider, MD  citalopram (CELEXA) 20 MG tablet Take 20 mg by mouth daily.  06/23/13  Yes Historical Provider, MD  diclofenac (FLECTOR) 1.3 % PTCH Place 1 patch  onto the skin daily. Apply  At 8am and take off at 8pm   Yes Historical Provider, MD  docusate (COLACE) 50 MG/5ML liquid Take 100 mg by mouth daily.   Yes Historical Provider, MD  docusate (COLACE) 50 MG/5ML liquid Take 200 mg by mouth daily as needed for mild constipation.   Yes Historical Provider, MD  ENSURE PLUS (ENSURE PLUS) LIQD Take 0.5 Cans by mouth 2 (two) times daily.   Yes Historical Provider, MD  levothyroxine (SYNTHROID, LEVOTHROID) 50 MCG tablet Take 50 mcg by mouth daily.   Yes Historical Provider, MD  Memantine HCl ER (NAMENDA XR) 28 MG CP24 Take 28 mg by mouth daily.   Yes Historical Provider, MD  Multiple Vitamins-Minerals (CERTAVITE/ANTIOXIDANTS PO) Take 1 tablet by mouth daily.   Yes Historical Provider, MD  Pollen Extracts (PROSTAT PO) Take 30 mLs by mouth 2 (two) times daily.    Yes Historical Provider, MD  polyethylene glycol powder (GLYCOLAX/MIRALAX) powder Take 17 g by mouth daily.  10/11/12  Yes Historical Provider, MD  rivastigmine (EXELON) 9.5 mg/24hr Place 1 patch onto the skin daily.   Yes Historical Provider, MD  senna (SENOKOT) 8.6 MG tablet Take 1 tablet by mouth at bedtime.   Yes Historical Provider, MD    Current Facility-Administered Medications  Medication Dose Route Frequency Provider Last Rate Last Dose  . citalopram (CELEXA) tablet 20 mg  20 mg Oral Daily Thurnell Lose, MD      . dextrose 5 % with KCl 20 mEq / L  infusion  20 mEq Intravenous Continuous Thurnell Lose, MD 50 mL/hr at 10/13/13 1202 20 mEq at 10/13/13 1202  . guaiFENesin-dextromethorphan (ROBITUSSIN DM) 100-10 MG/5ML syrup 5 mL  5 mL Oral Q4H PRN Thurnell Lose, MD      . hydrALAZINE (APRESOLINE) injection 10 mg  10 mg Intravenous Q6H PRN Thurnell Lose, MD      . HYDROcodone-acetaminophen (NORCO/VICODIN) 5-325 MG per tablet 1-2 tablet  1-2 tablet Oral Q4H PRN Thurnell Lose, MD      . levothyroxine (SYNTHROID, LEVOTHROID) tablet 50 mcg  50 mcg Oral QAC breakfast Thurnell Lose, MD   50 mcg at 10/13/13 1133  . Memantine HCl ER CP24 28 mg  28 mg Oral Daily Thurnell Lose, MD      . ondansetron [facility]) tablet 4 mg  4 mg Oral Q6H PRN Thurnell Lose, MD       Or  . ondansetron (ZOFRAN) injection 4 mg  4 mg Intravenous Q6H PRN Thurnell Lose, MD      . pantoprazole (PROTONIX) 80 mg in sodium chloride 0.9 % 250 mL infusion  8 mg/hr Intravenous Continuous Thurnell Lose, MD 25 mL/hr at 10/13/13 1159 8 mg/hr at 10/13/13 1159  . pantoprazole (PROTONIX) injection 40 mg  40 mg Intravenous Q12H Thurnell Lose, MD   40 mg at 10/13/13 1110  . rivastigmine (EXELON) 9.5 mg/24hr 9.5 mg  9.5 mg Transdermal Daily Thurnell Lose, MD        Allergies as of 10/13/2013 - Review Complete 10/13/2013  Allergen Reaction Noted  . Sulfa antibiotics  12/09/2011    Review of Systems:     Unobtainable - patient has dementia and is confused   Physical Exam:  Vital signs in last 24 hours: Temp:  [97.4 F (36.3 C)-98.3 F (36.8 C)] 97.4 F (36.3 C) (03/13 1312) Pulse Rate:  [67-73] 69 (03/13 1312) Resp:  [14-20] 18 (03/13 1312) BP: (126-189)/(60-90)  154/60 mmHg (03/13 1312) SpO2:  [92 %-98 %] 98 % (03/13 1312) Weight:  [133 lb 2.5 oz (60.4 kg)] 133 lb 2.5 oz (60.4 kg) (03/13 1312)   General:   Pleasant white female in NAD Head:  Normocephalic and atraumatic. Eyes:   No icterus.   Conjunctiva pink. Ears:  Normal auditory acuity. Neck:  Supple; no masses felt Lungs:  Respirations even and unlabored. Lungs clear to auscultation bilaterally.   No wheezes, crackles, or rhonchi.  Heart:  Regular rate and rhythm. Abdomen:  Soft,  nontender. Lower abdomen mildly distended with areas of firmness. Normal bowel sounds. No appreciable hepatomegaly.  Rectal:  Not impacted with hard stool. There is moderate soft stool in vault with is reddish in color   Msk:  Symmetrical without gross deformities.  Extremities:  Without edema. Neurologic:  Alert , confused Skin:  Intact without significant lesions or rashes. Cervical Nodes:  No significant cervical adenopathy. Psych:  pleasnat and cooperative.   LAB RESULTS:  Recent Labs  10/13/13 0755  WBC 6.7  HGB 12.8  HCT 40.6  PLT 294   BMET  Recent Labs  10/13/13 0755  NA 144  K 3.9  CL 102  CO2 31  GLUCOSE 90  BUN 22  CREATININE 0.85  CALCIUM 10.9*   LFT  Recent Labs  10/13/13 0755  PROT 7.6  ALBUMIN 3.5  AST 18  ALT 10  ALKPHOS 86  BILITOT 0.3   PT/INR  Recent Labs  10/13/13 0755  LABPROT 11.5*  INR 0.85    STUDIES: Ct Abdomen Pelvis W Contrast  10/13/2013   CLINICAL DATA:  Bloody stools, history of rectal malignancy now with mildly distended abdomen  EXAM: CT ABDOMEN AND PELVIS WITH CONTRAST  TECHNIQUE: Multidetector CT imaging of the abdomen and pelvis was performed using the standard protocol  following bolus administration of intravenous contrast.  CONTRAST:  120mL OMNIPAQUE IOHEXOL 300 MG/ML SOLN ; the patient also received oral contrast material.  COMPARISON:  DG ABDOMEN 1V dated 07/18/2012  FINDINGS: The orally administration. Contrast has reached the right colon. There is considerable stool burden throughout the colon without signs of obstruction. No discrete rectum or perirectal mass is demonstrated. No pelvic lymphadenopathy is demonstrated.  The small bowel exhibits no evidence of enteritis nor obstruction or ileus. The terminal ileum is normal in appearance. There is a normal caliber to uninflamed appearing appendix demonstrated on images 42 through 49.  There is a large hiatal hernia -partially intrathoracic stomach. The duodenum exhibits no inflammatory change. The liver, gallbladder, pancreas, spleen, adrenal glands, and kidneys exhibit no acute abnormalities. There is a 1.8 cm diameter hypodensity exophytic from the posterior aspect of the midpole of the left kidney with HU measurement of +20 compatible with a cyst. Along the course of the ureters no stones are evident. The urinary bladder is distended. The uterus is surgically absent. There are no adnexal masses. The caliber of the abdominal aorta is normal. There is no periaortic or pericaval lymphadenopathy. There is no evidence of ascites.  There is compression of the bodies of T12 and L2 with kyphoplasty having been performed at T12. No large retropulsed bony fragments are demonstrated. The bony pelvis exhibits no acute abnormality. The lung bases exhibit no evidence of pneumonia. Fibrotic changes are present.  IMPRESSION: 1. There is a large stool burden throughout the colon without evidence of ileus nor of obstruction. There are no suspicious colonic masses, and there is no evidence of acute diverticulitis. The perirectal soft  tissues exhibit no acute abnormalities. 2. There is no evidence of urinary tract obstruction. The urinary  bladder is mildly distended however. 3. There is no acute hepatobiliary abnormality. 4. There is a large hiatal hernia-partially intrathoracic stomach. No acute abnormality of the stomach or duodenum or remainder of the small bowel is demonstrated. 5. No pathologic sized intraperitoneal or retroperitoneal lymph nodes are demonstrated. There is no evidence of ascites.   Electronically Signed   By: David  Martinique   On: 10/13/2013 10:05    PREVIOUS ENDOSCOPIES:  Flexible sigmoidoscopy June 2014           DESCRIPTION OF PROCEDURE: After the risks benefits and  alternatives of the procedure were thoroughly explained, informed  consent was obtained. revealed no abnormalities of the rectum. The  LB [id number] endoscope was introduced through the anus  and advanced to the sigmoid colon , limited by No adverse events  experienced. The quality of the prep was excellent . The  instrument was then slowly withdrawn as the mucosa was fully  examined.  1.  COLON FINDINGS: A sessile polyp measuring 15 mm in size was found in  the rectum. In distal rectum near anal verge.  Multiple biopsies were performed using cold forceps and snare.  2. Diverticulosis was noted in the sigmoid colon.  Retroflexed views revealed no abnormalities. The scope was then  withdrawn from the patient and the procedure terminated.  COMPLICATIONS:There were no complications.  ENDOSCOPIC IMPRESSION:  1. Sessile polyp measuring 15 mm in size was found in the rectum;  multiple biopsies were performed using cold forceps  2. Diverticulosis was noted in the sigmoid colon  RECOMMENDATIONS:  await biopsy results - do not think this lesion is a threat to her  health at this time - it had a firm base ? from prior XRT vs.  lesion itself - doubt it could be removed with scope due to firm  base but could consider ablative Tx     Impression / Plan:   66. 80 year old female with history of rectal cancer in 2011 (uT3,uN1)  treated with  Xeloda and radiation. Her last colonoscopy done June 2014 revealed a 44mm rectal polyp (tubular adenoma).   2. GI bleeding, presumably lower. She is hemodynamically stable. Hgb 12.8 this am ( no baseline available). No evidence for ischemia on CTscan. Diverticular possible. Also consider hemorrhoidal, sterocal ulcer, etc..Patient has stool burden throughout colon. Will give SMOG enemas and see where we are with the bleeding. Monitor hgb  3. Constipation, give SMOG enemas  4. Lower abdominal distention, firmness. Wondered if this could be urinary retention. CTscan this am did show some mild bladder distention. I asked nursing staff to do bladder scan and she has over 911ml volume on scan. Will defer management to primary team but I did notify hospitalist. Foley ordered.     Thanks   LOS: 0 days   Tye Savoy  10/13/2013, 1:39 PM Attending MD note:   I have taken a history, examined the patient, and reviewed the chart. I agree with the Advanced Practitioner's impression and recommendations Hx of rectal cancer.. Low volume hematochezia, could be from radiiation proctitis or anal fissure from staining. Her Hgb is stable. She is tolerating liquid diet. Will hold off on sigmoidoscopy, observe, for further bleeding. treat with topical steroids.  [facility] Pager # [phone]

## 2013-10-14 LAB — BASIC METABOLIC PANEL
BUN: 16 mg/dL (ref 6–23)
CALCIUM: 9.1 mg/dL (ref 8.4–10.5)
CO2: 28 meq/L (ref 19–32)
CREATININE: 0.93 mg/dL (ref 0.50–1.10)
Chloride: 98 mEq/L (ref 96–112)
GFR calc non Af Amer: 54 mL/min — ABNORMAL LOW (ref >90)
GFR, EST AFRICAN AMERICAN: 62 mL/min — AB (ref >90)
Glucose, Bld: 103 mg/dL — ABNORMAL HIGH (ref 70–99)
Potassium: 3.9 mEq/L (ref 3.7–5.3)
Sodium: 137 mEq/L (ref 137–147)

## 2013-10-14 LAB — CBC
HCT: 32 % — ABNORMAL LOW (ref 36.0–46.0)
Hemoglobin: 10.4 g/dL — ABNORMAL LOW (ref 12.0–15.0)
MCH: 31.5 pg (ref 26.0–34.0)
MCHC: 32.5 g/dL (ref 30.0–36.0)
MCV: 97 fL (ref 78.0–100.0)
PLATELETS: 282 10*3/uL (ref 150–400)
RBC: 3.3 MIL/uL — ABNORMAL LOW (ref 3.87–5.11)
RDW: 13.2 % (ref 11.5–15.5)
WBC: 6.9 10*3/uL (ref 4.0–10.5)

## 2013-10-14 LAB — T4: T4 TOTAL: 8.5 ug/dL (ref 5.0–12.5)

## 2013-10-14 LAB — TSH: TSH: 5.331 u[IU]/mL — AB (ref 0.350–4.500)

## 2013-10-14 LAB — MRSA PCR SCREENING: MRSA by PCR: INVALID — AB

## 2013-10-14 LAB — CALCIUM, IONIZED: Calcium, Ion: 1.29 mmol/L (ref 1.13–1.30)

## 2013-10-14 MED ORDER — POLYETHYLENE GLYCOL 3350 17 G PO PACK
17.0000 g | PACK | Freq: Two times a day (BID) | ORAL | Status: DC
  Administered 2013-10-14 – 2013-10-15 (×3): 17 g via ORAL
  Filled 2013-10-14 (×4): qty 1

## 2013-10-14 MED ORDER — HYDROCORTISONE ACETATE 25 MG RE SUPP
25.0000 mg | Freq: Two times a day (BID) | RECTAL | Status: DC
  Administered 2013-10-14 – 2013-10-15 (×3): 25 mg via RECTAL
  Filled 2013-10-14 (×4): qty 1

## 2013-10-14 MED ORDER — DOCUSATE SODIUM 100 MG PO CAPS
200.0000 mg | ORAL_CAPSULE | Freq: Two times a day (BID) | ORAL | Status: DC
  Administered 2013-10-14 – 2013-10-15 (×3): 200 mg via ORAL
  Filled 2013-10-14 (×4): qty 2

## 2013-10-14 MED ORDER — PANTOPRAZOLE SODIUM 40 MG PO TBEC
40.0000 mg | DELAYED_RELEASE_TABLET | Freq: Every day | ORAL | Status: DC
  Administered 2013-10-14 – 2013-10-15 (×2): 40 mg via ORAL
  Filled 2013-10-14 (×2): qty 1

## 2013-10-14 NOTE — Progress Notes (Signed)
Patient Demographics  Sarah [name], is a 80 y.o. female, DOB - [birth year], UUV:[id number]  Admit date - 10/13/2013   Admitting Physician Thurnell Lose, MD  Outpatient Primary MD for the patient is Sarah Argyle, MD  LOS - 1   Chief Complaint  Patient presents with  . Rectal Bleeding        Assessment & Plan    1. Maroon-colored stools. Stable BUN, stable H&H, no nausea vomiting abdominal discomfort whatsoever, does have history of rectal cancer and rectal polyp. CT shows evidence of large stool burden, question if she has internal rectal tear due to constipation, GI following. For now continue supportive care. May need flex sig. Placed on bowel regimen along with enemas to clear stool burden, check TSH minimize narcotics.     2. History of rectal cancer and now rectal polyp. GI consulted. She status post chemotherapy radiation several years ago, rectal cancer itself thought to be in remission.       3. Dementia. At risk for delirium, home dementia medications will be continued. Have updated daughter and son personally over the phone.      4. Hypertension. Remained stable, When necessary IV hydralazine for now.      5.Mild Hypercalcemia - resolved after hydration.      6.Bladder distension - foley- flomax. Outpatient urology followup post discharge.     7. Hypothyroidism. Continue home dose Synthroid. Check TSH and T4   No results found for this basename: TSH      Code Status: DNR  Family Communication: Son  Disposition Plan: SNF   Procedures CT abdomen pelvis   Consults  GI   Medications  Scheduled Meds: . citalopram  20 mg Oral Daily  . docusate sodium  200 mg Oral BID  . hydrocortisone  25 mg Rectal BID  . levothyroxine  50 mcg Oral QAC  breakfast  . Memantine HCl ER  28 mg Oral Daily  . pantoprazole  40 mg Oral Daily  . polyethylene glycol  17 g Oral Daily  . polyethylene glycol  17 g Oral BID  . rivastigmine  9.5 mg Transdermal Daily  . tamsulosin  0.4 mg Oral Daily   Continuous Infusions:  PRN Meds:.guaiFENesin-dextromethorphan, hydrALAZINE, HYDROcodone-acetaminophen, ondansetron (ZOFRAN) IV, ondansetron  DVT Prophylaxis   SCDs    Lab Results  Component Value Date   PLT 282 10/14/2013    Antibiotics     Anti-infectives   None          Subjective:   Ike Bene today has, No headache, No chest pain, No abdominal pain - No Nausea, No new weakness tingling or numbness, No Cough - SOB.    Objective:   Filed Vitals:   10/13/13 1506 10/13/13 2053 10/14/13 0500 10/14/13 0606  BP: 171/63 110/62 116/66   Pulse: 63 73 75   Temp: 98 F (36.7 C) 97.5 F (36.4 C) 97.9 F (36.6 C)   TempSrc: Oral Oral Oral   Resp: 17 18 18    Height:      Weight:    59.6 kg (131 lb 6.3 oz)  SpO2: 96% 94% 96%     Wt Readings from Last 3 Encounters:  10/14/13 59.6 kg (131 lb 6.3 oz)  01/27/13 59.875 kg (  132 lb)  12/30/12 59.875 kg (132 lb)     Intake/Output Summary (Last 24 hours) at 10/14/13 0940 Last data filed at 10/14/13 0605  Gross per 24 hour  Intake 1178.75 ml  Output   2100 ml  Net -921.25 ml     Physical Exam  Awake and pleasantly confused, No new F.N deficits, Normal affect [place].AT,PERRAL Supple Neck,No JVD, No cervical lymphadenopathy appriciated.  Symmetrical Chest wall movement, Good air movement bilaterally, CTAB RRR,No Gallops,Rubs or new Murmurs, No Parasternal Heave +ve B.Sounds, Abd Soft, Non tender, No organomegaly appriciated, No rebound - guarding or rigidity. No Cyanosis, Clubbing or edema, No new Rash or bruise      Data Review   Micro Results Recent Results (from the past 240 hour(s))  MRSA PCR SCREENING     Status: Abnormal   Collection Time    10/13/13  9:30 PM      Result  Value Ref Range Status   MRSA by PCR INVALID RESULTS, SPECIMEN SENT FOR CULTURE (*) NEGATIVE Final   Comment: RESULT CALLED TO, READ BACK BY AND VERIFIED WITH:     SPOKE WITH HUDSON,W RN [phone] [id number] COVINGTON,N                The GeneXpert MRSA Assay (FDA     approved for NASAL specimens     only), is one component of a     comprehensive MRSA colonization     surveillance program. It is not     intended to diagnose MRSA     infection nor to guide or     monitor treatment for     MRSA infections.    Radiology Reports Ct Abdomen Pelvis W Contrast  10/13/2013   CLINICAL DATA:  Bloody stools, history of rectal malignancy now with mildly distended abdomen  EXAM: CT ABDOMEN AND PELVIS WITH CONTRAST  TECHNIQUE: Multidetector CT imaging of the abdomen and pelvis was performed using the standard protocol following bolus administration of intravenous contrast.  CONTRAST:  175mL OMNIPAQUE IOHEXOL 300 MG/ML SOLN ; the patient also received oral contrast material.  COMPARISON:  DG ABDOMEN 1V dated 07/18/2012  FINDINGS: The orally administration. Contrast has reached the right colon. There is considerable stool burden throughout the colon without signs of obstruction. No discrete rectum or perirectal mass is demonstrated. No pelvic lymphadenopathy is demonstrated.  The small bowel exhibits no evidence of enteritis nor obstruction or ileus. The terminal ileum is normal in appearance. There is a normal caliber to uninflamed appearing appendix demonstrated on images 42 through 49.  There is a large hiatal hernia -partially intrathoracic stomach. The duodenum exhibits no inflammatory change. The liver, gallbladder, pancreas, spleen, adrenal glands, and kidneys exhibit no acute abnormalities. There is a 1.8 cm diameter hypodensity exophytic from the posterior aspect of the midpole of the left kidney with HU measurement of +20 compatible with a cyst. Along the course of the ureters no stones are evident. The urinary  bladder is distended. The uterus is surgically absent. There are no adnexal masses. The caliber of the abdominal aorta is normal. There is no periaortic or pericaval lymphadenopathy. There is no evidence of ascites.  There is compression of the bodies of T12 and L2 with kyphoplasty having been performed at T12. No large retropulsed bony fragments are demonstrated. The bony pelvis exhibits no acute abnormality. The lung bases exhibit no evidence of pneumonia. Fibrotic changes are present.  IMPRESSION: 1. There is a large stool burden throughout the colon without evidence  of ileus nor of obstruction. There are no suspicious colonic masses, and there is no evidence of acute diverticulitis. The perirectal soft tissues exhibit no acute abnormalities. 2. There is no evidence of urinary tract obstruction. The urinary bladder is mildly distended however. 3. There is no acute hepatobiliary abnormality. 4. There is a large hiatal hernia-partially intrathoracic stomach. No acute abnormality of the stomach or duodenum or remainder of the small bowel is demonstrated. 5. No pathologic sized intraperitoneal or retroperitoneal lymph nodes are demonstrated. There is no evidence of ascites.   Electronically Signed   By: David  Martinique   On: 10/13/2013 10:05    CBC  Recent Labs Lab 10/13/13 0755 10/13/13 1443 10/13/13 1840 10/14/13 0154  WBC 6.7  --   --  6.9  HGB 12.8 11.7* 11.8* 10.4*  HCT 40.6 36.8 36.9 32.0*  PLT 294  --   --  282  MCV 101.0*  --   --  97.0  MCH 31.8  --   --  31.5  MCHC 31.5  --   --  32.5  RDW 13.4  --   --  13.2    Chemistries   Recent Labs Lab 10/13/13 0755 10/14/13 0154  NA 144 137  K 3.9 3.9  CL 102 98  CO2 31 28  GLUCOSE 90 103*  BUN 22 16  CREATININE 0.85 0.93  CALCIUM 10.9* 9.1  AST 18  --   ALT 10  --   ALKPHOS 86  --   BILITOT 0.3  --    ------------------------------------------------------------------------------------------------------------------ estimated  creatinine clearance is 40.1 ml/min (by C-G formula based on Cr of 0.93). ------------------------------------------------------------------------------------------------------------------ No results found for this basename: HGBA1C,  in the last 72 hours ------------------------------------------------------------------------------------------------------------------ No results found for this basename: CHOL, HDL, LDLCALC, TRIG, CHOLHDL, LDLDIRECT,  in the last 72 hours ------------------------------------------------------------------------------------------------------------------ No results found for this basename: TSH, T4TOTAL, FREET3, T3FREE, THYROIDAB,  in the last 72 hours ------------------------------------------------------------------------------------------------------------------ No results found for this basename: VITAMINB12, FOLATE, FERRITIN, TIBC, IRON, RETICCTPCT,  in the last 72 hours  Coagulation profile  Recent Labs Lab 10/13/13 0755  INR 0.85    No results found for this basename: DDIMER,  in the last 72 hours  Cardiac Enzymes No results found for this basename: CK, CKMB, TROPONINI, MYOGLOBIN,  in the last 168 hours ------------------------------------------------------------------------------------------------------------------ No components found with this basename: POCBNP,      Time Spent in minutes   35   Lala Lund K M.D on 10/14/2013 at 9:40 AM  Between 7am to 7pm - Pager - [phone]  After 7pm go to [email or web address] - password [email or web address]  And look for the night coverage person covering for me after hours  [facility] Office  [phone]

## 2013-10-14 NOTE — Progress Notes (Signed)
Progress Note   Subjective  no complaints. Sitter in room. No BMs/ bleeding this am. Tolerating clear   Objective   Vital signs in last 24 hours: Temp:  [97.4 F (36.3 C)-98 F (36.7 C)] 97.9 F (36.6 C) (03/14 0500) Pulse Rate:  [63-75] 75 (03/14 0500) Resp:  [15-20] 18 (03/14 0500) BP: (110-180)/(60-90) 116/66 mmHg (03/14 0500) SpO2:  [92 %-98 %] 96 % (03/14 0500) Weight:  [131 lb 6.3 oz (59.6 kg)-133 lb 2.5 oz (60.4 kg)] 131 lb 6.3 oz (59.6 kg) (03/14 0606) Last BM Date: 10/13/13 General:    Pleasant white female in NAD Heart:  Regular rate and rhythm Lungs: Respirations even and unlabored Abdomen:  nontender, mild to moderately distended but soft with active bowel sounds. No masses felt.  Extremities:  Without edema. Neurologic:  Alert , grossly normal neurologically.   Lab Results:  Recent Labs  10/13/13 0755 10/13/13 1443 10/13/13 1840 10/14/13 0154  WBC 6.7  --   --  6.9  HGB 12.8 11.7* 11.8* 10.4*  HCT 40.6 36.8 36.9 32.0*  PLT 294  --   --  282   BMET  Recent Labs  10/13/13 0755 10/14/13 0154  NA 144 137  K 3.9 3.9  CL 102 98  CO2 31 28  GLUCOSE 90 103*  BUN 22 16  CREATININE 0.85 0.93  CALCIUM 10.9* 9.1   LFT  Recent Labs  10/13/13 0755  PROT 7.6  ALBUMIN 3.5  AST 18  ALT 10  ALKPHOS 86  BILITOT 0.3   PT/INR  Recent Labs  10/13/13 0755  LABPROT 11.5*  INR 0.85    Studies/Results: Ct Abdomen Pelvis W Contrast  10/13/2013   CLINICAL DATA:  Bloody stools, history of rectal malignancy now with mildly distended abdomen  EXAM: CT ABDOMEN AND PELVIS WITH CONTRAST  TECHNIQUE: Multidetector CT imaging of the abdomen and pelvis was performed using the standard protocol following bolus administration of intravenous contrast.  CONTRAST:  138mL OMNIPAQUE IOHEXOL 300 MG/ML SOLN ; the patient also received oral contrast material.  COMPARISON:  DG ABDOMEN 1V dated 07/18/2012  FINDINGS: The orally administration. Contrast has reached the  right colon. There is considerable stool burden throughout the colon without signs of obstruction. No discrete rectum or perirectal mass is demonstrated. No pelvic lymphadenopathy is demonstrated.  The small bowel exhibits no evidence of enteritis nor obstruction or ileus. The terminal ileum is normal in appearance. There is a normal caliber to uninflamed appearing appendix demonstrated on images 42 through 49.  There is a large hiatal hernia -partially intrathoracic stomach. The duodenum exhibits no inflammatory change. The liver, gallbladder, pancreas, spleen, adrenal glands, and kidneys exhibit no acute abnormalities. There is a 1.8 cm diameter hypodensity exophytic from the posterior aspect of the midpole of the left kidney with HU measurement of +20 compatible with a cyst. Along the course of the ureters no stones are evident. The urinary bladder is distended. The uterus is surgically absent. There are no adnexal masses. The caliber of the abdominal aorta is normal. There is no periaortic or pericaval lymphadenopathy. There is no evidence of ascites.  There is compression of the bodies of T12 and L2 with kyphoplasty having been performed at T12. No large retropulsed bony fragments are demonstrated. The bony pelvis exhibits no acute abnormality. The lung bases exhibit no evidence of pneumonia. Fibrotic changes are present.  IMPRESSION: 1. There is a large stool burden throughout the colon without evidence of ileus nor of obstruction.  There are no suspicious colonic masses, and there is no evidence of acute diverticulitis. The perirectal soft tissues exhibit no acute abnormalities. 2. There is no evidence of urinary tract obstruction. The urinary bladder is mildly distended however. 3. There is no acute hepatobiliary abnormality. 4. There is a large hiatal hernia-partially intrathoracic stomach. No acute abnormality of the stomach or duodenum or remainder of the small bowel is demonstrated. 5. No pathologic sized  intraperitoneal or retroperitoneal lymph nodes are demonstrated. There is no evidence of ascites.   Electronically Signed   By: David  Martinique   On: 10/13/2013 10:05     Assessment / Plan:   1. GI bleed, seems low volume. No bleeding today. Hgb down to 10.4 with IVF. This may have been hemorrhoidal . Will treat with steroid suppositories. Advance to regular diet. If recurrent bleeding will consider flexible sigmoidoscopy  58. 80 year old female with history of rectal cancer in 2011 (uT3,uN1)  treated with Xeloda and radiation. Her last sigmoidoscopy done June 2014 revealed a 31mm rectal polyp (tubular adenoma).   3. Constipation. Moderate stool expelled after SMOG. Continue daily Miralax.   4. Anemia, microcytic. Hgb down from 12.8 on admission to 10.4 today (after IV fluids).    LOS: 1 day   Tye Savoy  10/14/2013, 8:50 AM  Attending MD note:   I have taken a history and reviewed the chart. I agree with the Advanced Practitioner's impression and recommendations. No apparent bleeding. We will continue to use topical suppositories for presumed anorecta; source of bleeding. Will sign off  [facility] Pager # [phone]

## 2013-10-14 NOTE — Evaluation (Signed)
Physical Therapy Evaluation Patient Details Name: Sarah [name] MRN: [id number] DOB: Sep 21, 1923 Today's Date: 10/14/2013 Time: 4193-7902 PT Time Calculation (min): 27 min  PT Assessment / Plan / Recommendation History of Present Illness  Sarah [name]  is a 80 y.o. female, with H/O dementia , rectal CA T3N1 post radiation-chemo followed by Dr. Blair Promise, now being followed by Dr. Carlean Purl for rectal polyp, hypothyroidism, dyslipidemia, hypertension who lives in a nursing home and was brought in today for maroon-colored stools, patient is pleasantly demented with no subjective complaints whatsoever. She does not remember passing any dark-colored a maroon stools. She is completely symptom-free denies any headache chest pain, no abdominal pain cramping, no nausea vomiting. CT scan of the abdomen pelvis showed large stool burden but no acute changes. She seems medically stable with stable labs, I was called to admit the patient for GI bleed.  Clinical Impression  Pt will benefit from PT to address deficits below; Plan is to return to SNF    PT Assessment  Patient needs continued PT services    Follow Up Recommendations  SNF    Does the patient have the potential to tolerate intense rehabilitation      Barriers to Discharge        Equipment Recommendations  None recommended by PT    Recommendations for Other Services     Frequency Min 3X/week    Precautions / Restrictions Precautions Precautions: Fall   Pertinent Vitals/Pain Denies pain     Mobility  Bed Mobility Overal bed mobility: Needs Assistance Bed Mobility: Supine to Sit Supine to sit: Min assist General bed mobility comments: cues for self assist and technique Transfers Overall transfer level: Needs assistance Equipment used: Rolling walker (2 wheeled) Transfers: Sit to/from Stand Sit to Stand: Mod assist General transfer comment: repeated x 2 for hygiene; cues for hand placement Ambulation/Gait Ambulation/Gait  assistance: Min assist Ambulation Distance (Feet): 50 Feet Assistive device: Rolling walker (2 wheeled) Gait Pattern/deviations: Trunk flexed;Decreased stride length General Gait Details: cues for sequence, posture    Exercises     PT Diagnosis: Generalized weakness  PT Problem List: Decreased strength;Decreased range of motion;Decreased activity tolerance;Decreased balance;Decreased mobility;Decreased knowledge of use of DME PT Treatment Interventions: DME instruction;Gait training;Functional mobility training;Therapeutic activities;Therapeutic exercise;Balance training     PT Goals(Current goals can be found in the care plan section) Acute Rehab PT Goals Patient Stated Goal: pt does not state PT Goal Formulation: With patient Time For Goal Achievement: 10/21/13 Potential to Achieve Goals: Good  Visit Information  Last PT Received On: 10/14/13 Assistance Needed: +1 History of Present Illness: Sarah [name]  is a 80 y.o. female, with H/O dementia , rectal CA T3N1 post radiation-chemo followed by Dr. Blair Promise, now being followed by Dr. Carlean Purl for rectal polyp, hypothyroidism, dyslipidemia, hypertension who lives in a nursing home and was brought in today for maroon-colored stools, patient is pleasantly demented with no subjective complaints whatsoever. She does not remember passing any dark-colored a maroon stools. She is completely symptom-free denies any headache chest pain, no abdominal pain cramping, no nausea vomiting. CT scan of the abdomen pelvis showed large stool burden but no acute changes. She seems medically stable with stable labs, I was called to admit the patient for GI bleed.       Prior Functioning  Home Living Family/patient expects to be discharged to:: Skilled nursing facility    Cognition  Cognition Arousal/Alertness: Awake/alert Behavior During Therapy: [facility] for tasks assessed/performed Overall Cognitive Status: History of cognitive impairments -  at baseline     Extremity/Trunk Assessment Upper Extremity Assessment Upper Extremity Assessment: Generalized weakness Lower Extremity Assessment Lower Extremity Assessment: Generalized weakness Cervical / Trunk Assessment Cervical / Trunk Assessment: Kyphotic   Balance Balance Overall balance assessment: Needs assistance Sitting balance-Leahy Scale: Poor Postural control: Posterior lean Standing balance-Leahy Scale: Poor  End of Session PT - End of Session Equipment Utilized During Treatment: Gait belt Activity Tolerance: Patient tolerated treatment well Patient left: in bed;with call bell/phone within reach;with bed alarm set;with nursing/sitter in room Nurse Communication: Mobility status  GP     [facility] 10/14/2013, 4:36 PM

## 2013-10-15 MED ORDER — AMLODIPINE BESYLATE 10 MG PO TABS
10.0000 mg | ORAL_TABLET | Freq: Every day | ORAL | Status: DC
  Administered 2013-10-15: 10 mg via ORAL
  Filled 2013-10-15: qty 1

## 2013-10-15 MED ORDER — AMLODIPINE BESYLATE 10 MG PO TABS: 10.0000 mg | ORAL_TABLET | Freq: Every day | ORAL | Status: DC

## 2013-10-15 MED ORDER — POLYETHYLENE GLYCOL 3350 17 G PO PACK: 17.0000 g | PACK | Freq: Two times a day (BID) | ORAL | Status: DC

## 2013-10-15 MED ORDER — BISACODYL 10 MG RE SUPP: 10.0000 mg | Freq: Every day | RECTAL | Status: AC

## 2013-10-15 MED ORDER — PANTOPRAZOLE SODIUM 40 MG PO TBEC: 40.0000 mg | DELAYED_RELEASE_TABLET | Freq: Every day | ORAL | Status: AC

## 2013-10-15 MED ORDER — HYDROCORTISONE ACETATE 25 MG RE SUPP: 25.0000 mg | Freq: Two times a day (BID) | RECTAL | Status: DC

## 2013-10-15 MED ORDER — TAMSULOSIN HCL 0.4 MG PO CAPS: 0.4000 mg | ORAL_CAPSULE | Freq: Every day | ORAL | Status: DC

## 2013-10-15 MED ORDER — DOCUSATE SODIUM 50 MG/5ML PO LIQD: 200.0000 mg | Freq: Two times a day (BID) | ORAL | Status: DC

## 2013-10-15 NOTE — Progress Notes (Signed)
Pt left at this time with EMS, headed back to SNF, [facility]. Pt without s/s of distress.  VS WNL. Family aware.

## 2013-10-15 NOTE — Discharge Summary (Signed)
Sarah [name], is a 80 y.o. female  DOB February 04, 1924  MRN [id number].  Admission date:  10/13/2013  Admitting Physician  Thurnell Lose, MD  Discharge Date:  10/15/2013   Primary MD  [name], MD  Recommendations for primary care physician for things to follow:   Monitor Urine output, just bowel regimen and blood pressure medications as needed   Admission Diagnosis  Rectal cancer [154.1] GI bleed [578.9] GIB (gastrointestinal bleeding) [578.9] Dementia [294.20]   Discharge Diagnosis  Rectal cancer [154.1] GI bleed [578.9] GIB (gastrointestinal bleeding) [578.9] Dementia [294.20]   Severe constipation with possible rectal tear  Principal Problem:   GIB (gastrointestinal bleeding) Active Problems:   Rectal cancer   Mycotic toenails   Dementia   GI bleed      Past Medical History  Diagnosis Date  . Rectal cancer 12/2009    T3N1, chemoradiation therapy  . Hypothyroidism   . Hyperlipidemia   . Uterine cancer 1990's  . Constipation   . Diverticulosis     descending colon & sigmoid colon  . History of kidney stones   . History of pneumonia   . Osteoporosis   . Closed fracture of dorsal (thoracic) vertebra without mention of spinal cord injury   . Closed fracture of lumbar vertebra without mention of spinal cord injury   . Mitral valve disorders   . Mycotic toenails 02/02/2013    Past Surgical History  Procedure Laterality Date  . Colonoscopy  12/2009  . Spinal injection    . Flexible sigmoidoscopy    . Cataracts    . Abdominal hysterectomy  1996    b/c uterine cancer  . Hammer toe surgery  2008, 2010 & 2011  . Eye surgery Bilateral     Cataract surgery     Discharge Condition: Stable   Follow UP  Follow-up Information   Follow up with [name], MD. Schedule an appointment as soon  as possible for a visit in 1 week.   Specialty:  Internal Medicine   Contact information:   [address] [place] [address] [phone]       Follow up with Silvano Rusk, MD. Schedule an appointment as soon as possible for a visit in 1 week.   Specialty:  Gastroenterology   Contact information:   [address] [address] [phone]         Discharge Instructions  and  Discharge Medications          Discharge Orders   Future Orders Complete By Expires   Diet - low sodium heart healthy  As directed    Discharge instructions  As directed    Comments:     Follow with Primary MD [name], MD in 7 days   Get CBC, CMP, checked 7 days by Primary MD and again as instructed by your Primary MD.     Activity: As tolerated with Full fall precautions use walker/cane & assistance as needed   Disposition SNF   Diet:  Heart Healthy with feeding assistance and aspiration precautions if needed.  For Heart failure patients - Check your Weight same time everyday, if you gain over 2 pounds, or you develop in leg swelling, experience more shortness of breath or chest pain, call your Primary MD immediately. Follow Cardiac Low Salt Diet and 1.8 lit/day fluid restriction.   On your next visit with her primary care physician please Get Medicines reviewed and adjusted.  Please request your Prim.MD to go over all Hospital Tests and Procedure/Radiological results at the follow up, please get all Hospital records sent to your Prim MD by signing hospital release before you go home.   If you experience worsening of your admission symptoms, develop shortness of breath, life threatening emergency, suicidal or homicidal thoughts you must seek medical attention immediately by calling 911 or calling your MD immediately  if symptoms less severe.  You Must read complete instructions/literature along with all the possible adverse reactions/side effects for  all the Medicines you take and that have been prescribed to you. Take any new Medicines after you have completely understood and accpet all the possible adverse reactions/side effects.   Do not drive and provide baby sitting services if your were admitted for syncope or siezures until you have seen by Primary MD or a Neurologist and advised to do so again.  Do not drive when taking Pain medications.    Do not take more than prescribed Pain, Sleep and Anxiety Medications  Special Instructions: If you have smoked or chewed Tobacco  in the last 2 yrs please stop smoking, stop any regular Alcohol  and or any Recreational drug use.  Wear Seat belts while driving.   Please note  You were cared for by a hospitalist during your hospital stay. If you have any questions about your discharge medications or the care you received while you were in the hospital after you are discharged, you can call the unit and asked to speak with the hospitalist on call if the hospitalist that took care of you is not available. Once you are discharged, your primary care physician will handle any further medical issues. Please note that NO REFILLS for any discharge medications will be authorized once you are discharged, as it is imperative that you return to your primary care physician (or establish a relationship with a primary care physician if you do not have one) for your aftercare needs so that they can reassess your need for medications and monitor your lab values.   Increase activity slowly  As directed        Medication List    STOP taking these medications       senna 8.6 MG tablet  Commonly known as:  SENOKOT      TAKE these medications       acetaminophen 500 MG tablet  Commonly known as:  TYLENOL  Take 1,000 mg by mouth every 4 (four) hours as needed for mild pain or fever.     amLODipine 10 MG tablet  Commonly known as:  NORVASC  Take 1 tablet (10 mg total) by mouth daily.     bisacodyl 10 MG  suppository  Commonly known as:  DULCOLAX  Place 1 suppository (10 mg total) rectally daily.     Calcium-Vitamin D 600-400 MG-UNIT Tabs  Take 1 tablet by mouth 2 (two) times daily.     CERTAVITE/ANTIOXIDANTS PO  Take 1 tablet by mouth daily.     Cholecalciferol 1000 UNITS Tbdp  Take 1 tablet  by mouth daily.     citalopram 20 MG tablet  Commonly known as:  CELEXA  Take 20 mg by mouth daily.     diclofenac 1.3 % Ptch  Commonly known as:  FLECTOR  Place 1 patch onto the skin daily. Apply  At 8am and take off at 8pm     docusate 50 MG/5ML liquid  Commonly known as:  COLACE  Take 20 mLs (200 mg total) by mouth 2 (two) times daily.     docusate 50 MG/5ML liquid  Commonly known as:  COLACE  Take 100 mg by mouth daily.     ENSURE PLUS Liqd  Take 0.5 Cans by mouth 2 (two) times daily.     hydrocortisone 25 MG suppository  Commonly known as:  ANUSOL-HC  Place 1 suppository (25 mg total) rectally 2 (two) times daily.     levothyroxine 50 MCG tablet  Commonly known as:  SYNTHROID, LEVOTHROID  Take 50 mcg by mouth daily.     NAMENDA XR 28 MG Cp24  Generic drug:  Memantine HCl ER  Take 28 mg by mouth daily.     pantoprazole 40 MG tablet  Commonly known as:  PROTONIX  Take 1 tablet (40 mg total) by mouth daily.     polyethylene glycol powder powder  Commonly known as:  GLYCOLAX/MIRALAX  Take 17 g by mouth daily.     polyethylene glycol packet  Commonly known as:  MIRALAX / GLYCOLAX  Take 17 g by mouth 2 (two) times daily.     PROSTAT PO  Take 30 mLs by mouth 2 (two) times daily.     rivastigmine 9.5 mg/24hr  Commonly known as:  EXELON  Place 1 patch onto the skin daily.     tamsulosin 0.4 MG Caps capsule  Commonly known as:  FLOMAX  Take 1 capsule (0.4 mg total) by mouth daily after supper.          Diet and Activity recommendation: See Discharge Instructions above   Consults obtained - GI   Major procedures and Radiology Reports - PLEASE review detailed  and final reports for all details, in brief -       Ct Abdomen Pelvis W Contrast  10/13/2013   CLINICAL DATA:  Bloody stools, history of rectal malignancy now with mildly distended abdomen  EXAM: CT ABDOMEN AND PELVIS WITH CONTRAST  TECHNIQUE: Multidetector CT imaging of the abdomen and pelvis was performed using the standard protocol following bolus administration of intravenous contrast.  CONTRAST:  179mL OMNIPAQUE IOHEXOL 300 MG/ML SOLN ; the patient also received oral contrast material.  COMPARISON:  DG ABDOMEN 1V dated 07/18/2012  FINDINGS: The orally administration. Contrast has reached the right colon. There is considerable stool burden throughout the colon without signs of obstruction. No discrete rectum or perirectal mass is demonstrated. No pelvic lymphadenopathy is demonstrated.  The small bowel exhibits no evidence of enteritis nor obstruction or ileus. The terminal ileum is normal in appearance. There is a normal caliber to uninflamed appearing appendix demonstrated on images 42 through 49.  There is a large hiatal hernia -partially intrathoracic stomach. The duodenum exhibits no inflammatory change. The liver, gallbladder, pancreas, spleen, adrenal glands, and kidneys exhibit no acute abnormalities. There is a 1.8 cm diameter hypodensity exophytic from the posterior aspect of the midpole of the left kidney with HU measurement of +20 compatible with a cyst. Along the course of the ureters no stones are evident. The urinary bladder is distended. The uterus is surgically  absent. There are no adnexal masses. The caliber of the abdominal aorta is normal. There is no periaortic or pericaval lymphadenopathy. There is no evidence of ascites.  There is compression of the bodies of T12 and L2 with kyphoplasty having been performed at T12. No large retropulsed bony fragments are demonstrated. The bony pelvis exhibits no acute abnormality. The lung bases exhibit no evidence of pneumonia. Fibrotic changes are  present.  IMPRESSION: 1. There is a large stool burden throughout the colon without evidence of ileus nor of obstruction. There are no suspicious colonic masses, and there is no evidence of acute diverticulitis. The perirectal soft tissues exhibit no acute abnormalities. 2. There is no evidence of urinary tract obstruction. The urinary bladder is mildly distended however. 3. There is no acute hepatobiliary abnormality. 4. There is a large hiatal hernia-partially intrathoracic stomach. No acute abnormality of the stomach or duodenum or remainder of the small bowel is demonstrated. 5. No pathologic sized intraperitoneal or retroperitoneal lymph nodes are demonstrated. There is no evidence of ascites.   Electronically Signed   By: David  Martinique   On: 10/13/2013 10:05    Micro Results      Recent Results (from the past 240 hour(s))  MRSA PCR SCREENING     Status: Abnormal   Collection Time    10/13/13  9:30 PM      Result Value Ref Range Status   MRSA by PCR INVALID RESULTS, SPECIMEN SENT FOR CULTURE (*) NEGATIVE Final   Comment: RESULT CALLED TO, READ BACK BY AND VERIFIED WITH:     SPOKE WITH HUDSON,W RN [phone] [id number] COVINGTON,N                The GeneXpert MRSA Assay (FDA     approved for NASAL specimens     only), is one component of a     comprehensive MRSA colonization     surveillance program. It is not     intended to diagnose MRSA     infection nor to guide or     monitor treatment for     MRSA infections.     History of present illness and  Hospital Course:     Kindly see H&P for history of present illness and admission details, please review complete Labs, Consult reports and Test reports for all details in brief Sarah [name], is a 80 y.o. female, patient with history of  H/O dementia , rectal CA T3N1 post radiation-chemo followed by Dr. Blair Promise, now being followed by Dr. Carlean Purl for rectal polyp, hypothyroidism, dyslipidemia, hypertension who lives in a nursing home and was brought  in today for maroon-colored stools, patient is pleasantly demented with no subjective complaints whatsoever. She does not remember passing any dark-colored a maroon stools. She is completely symptom-free denies any headache chest pain, no abdominal pain cramping, no nausea vomiting. CT scan of the abdomen pelvis showed large stool burden but no acute changes. She seems medically stable with stable labs, I was called to admit the patient for GI bleed.      1. Maroon-colored stools. Seen by GI, H&H remained stable, GI bleed likely due to anorectal tear from severe constipation and stool burden. Patient is much improved after being initiated on bowel regimen and multiple enema treatments. No further sign of ongoing bleed. Due to her history of rectal cancer will continue to follow with GI in the outpatient setting post discharge.      2. History of rectal cancer and now rectal polyp.  GI consulted. She status post chemotherapy radiation several years ago, rectal cancer itself thought to be in remission. Outpatient followup with GI and oncology as desired.      3. Dementia. At risk for delirium, home dementia medications will be continued unchanged.     4. Hypertension. Added Norvasc for better control continue to monitor and adjust medications as needed.     5.Mild Hypercalcemia - resolved after hydration.      6.Bladder distension - nightly due to massive stool burden compressing on bladder, initially required Foley, placed on Flomax. Foley removed this morning. We will monitor urine output. If she is unable to void again Foley will be replaced and then she will require outpatient urology followup. Will request nursing home staff to keep an eye on her urine output and bladder emptying.     7. Hypothyroidism. Continue home dose Synthroid. Repeat TSH and T4 and free T3 in 4 weeks.   Lab Results  Component Value Date   TSH 5.331* 10/14/2013        Today   Subjective:    Aura Covino today has no headache,no chest abdominal pain,no new weakness tingling or numbness, feels much better wants to go home today.    Objective:   Blood pressure 178/79, pulse 71, temperature 97.6 F (36.4 C), temperature source Oral, resp. rate 18, height 5\' 7"  (1.702 m), weight 60.7 kg (133 lb 13.1 oz), SpO2 97.00%.   Intake/Output Summary (Last 24 hours) at 10/15/13 0747 Last data filed at 10/15/13 [id number]  Gross per 24 hour  Intake    900 ml  Output   1900 ml  Net  -1000 ml    Exam Awake Alert, Oriented *3, No new F.N deficits, Normal affect Bath.AT,PERRAL Supple Neck,No JVD, No cervical lymphadenopathy appriciated.  Symmetrical Chest wall movement, Good air movement bilaterally, CTAB RRR,No Gallops,Rubs or new Murmurs, No Parasternal Heave +ve B.Sounds, Abd Soft, Non tender, No organomegaly appriciated, No rebound -guarding or rigidity. No Cyanosis, Clubbing or edema, No new Rash or bruise  Data Review   CBC w Diff: Lab Results  Component Value Date   WBC 6.9 10/14/2013   HGB 10.4* 10/14/2013   HCT 32.0* 10/14/2013   PLT 282 10/14/2013    CMP: Lab Results  Component Value Date   NA 137 10/14/2013   K 3.9 10/14/2013   CL 98 10/14/2013   CO2 28 10/14/2013   BUN 16 10/14/2013   CREATININE 0.93 10/14/2013   PROT 7.6 10/13/2013   ALBUMIN 3.5 10/13/2013   BILITOT 0.3 10/13/2013   ALKPHOS 86 10/13/2013   AST 18 10/13/2013   ALT 10 10/13/2013  .   Total Time in preparing paper work, data evaluation and todays exam - 35 minutes  Thurnell Lose M.D on 10/15/2013 at 7:47 AM  [facility] Office  [phone]

## 2013-10-15 NOTE — Discharge Instructions (Signed)
Follow with Primary MD [name], MD in 7 days   Get CBC, CMP, checked 7 days by Primary MD and again as instructed by your Primary MD.     Activity: As tolerated with Full fall precautions use walker/cane & assistance as needed   Disposition SNF   Diet: Heart Healthy with feeding assistance and aspiration precautions if needed.  For Heart failure patients - Check your Weight same time everyday, if you gain over 2 pounds, or you develop in leg swelling, experience more shortness of breath or chest pain, call your Primary MD immediately. Follow Cardiac Low Salt Diet and 1.8 lit/day fluid restriction.   On your next visit with her primary care physician please Get Medicines reviewed and adjusted.  Please request your Prim.MD to go over all Hospital Tests and Procedure/Radiological results at the follow up, please get all Hospital records sent to your Prim MD by signing hospital release before you go home.   If you experience worsening of your admission symptoms, develop shortness of breath, life threatening emergency, suicidal or homicidal thoughts you must seek medical attention immediately by calling 911 or calling your MD immediately  if symptoms less severe.  You Must read complete instructions/literature along with all the possible adverse reactions/side effects for all the Medicines you take and that have been prescribed to you. Take any new Medicines after you have completely understood and accpet all the possible adverse reactions/side effects.   Do not drive and provide baby sitting services if your were admitted for syncope or siezures until you have seen by Primary MD or a Neurologist and advised to do so again.  Do not drive when taking Pain medications.    Do not take more than prescribed Pain, Sleep and Anxiety Medications  Special Instructions: If you have smoked or chewed Tobacco  in the last 2 yrs please stop smoking, stop any regular Alcohol  and or any  Recreational drug use.  Wear Seat belts while driving.   Please note  You were cared for by a hospitalist during your hospital stay. If you have any questions about your discharge medications or the care you received while you were in the hospital after you are discharged, you can call the unit and asked to speak with the hospitalist on call if the hospitalist that took care of you is not available. Once you are discharged, your primary care physician will handle any further medical issues. Please note that NO REFILLS for any discharge medications will be authorized once you are discharged, as it is imperative that you return to your primary care physician (or establish a relationship with a primary care physician if you do not have one) for your aftercare needs so that they can reassess your need for medications and monitor your lab values.

## 2013-10-15 NOTE — Progress Notes (Signed)
Per MD, Pt ready for d/c.  Notified RN, Pt, family and facility.  Sent d/c summary.  Confirmed receipt of d/c summary.  Facility ready to receive Pt.  Arranged for transportation.  Amanda Jazalyn Mondor, LCSWA Clinical Social Work [phone]   

## 2013-10-15 NOTE — Progress Notes (Signed)
Writer called [facility] and gave report. Ambulance has been called to transfer pt this afternoon.

## 2013-10-16 LAB — MRSA CULTURE

## 2013-10-24 ENCOUNTER — Ambulatory Visit: Payer: Medicare Other | Admitting: Nurse Practitioner

## 2013-11-17 ENCOUNTER — Encounter: Payer: Self-pay | Admitting: Gastroenterology

## 2013-12-05 ENCOUNTER — Ambulatory Visit (INDEPENDENT_AMBULATORY_CARE_PROVIDER_SITE_OTHER): Payer: Medicare Other | Admitting: Internal Medicine

## 2013-12-05 ENCOUNTER — Encounter: Payer: Self-pay | Admitting: Internal Medicine

## 2013-12-05 VITALS — BP 102/60 | HR 72

## 2013-12-05 DIAGNOSIS — K922 Gastrointestinal hemorrhage, unspecified: Secondary | ICD-10-CM

## 2013-12-05 DIAGNOSIS — Z85038 Personal history of other malignant neoplasm of large intestine: Secondary | ICD-10-CM

## 2013-12-05 DIAGNOSIS — D129 Benign neoplasm of anus and anal canal: Secondary | ICD-10-CM

## 2013-12-05 DIAGNOSIS — D128 Benign neoplasm of rectum: Secondary | ICD-10-CM

## 2013-12-05 MED ORDER — NA SULFATE-K SULFATE-MG SULF 17.5-3.13-1.6 GM/177ML PO SOLN: ORAL | Status: DC

## 2013-12-05 NOTE — Patient Instructions (Addendum)
You have been scheduled for a colonoscopy at [facility] 12/27/13 at 1:30pm, arrive at 12:00pm.  Please follow written instructions faxed to [facility]. Please send rx for the moviprep to your pharmacy. If you use inhalers (even only as needed), please bring them with you on the day of your procedure.   I appreciate the opportunity to care for you.

## 2013-12-05 NOTE — Progress Notes (Signed)
Subjective:    Patient ID: Sarah [name], female    DOB: 1924-02-08, 80 y.o.   MRN: [id number]  HPI Pleasant elderly demented woman here after hospitalization for rectal bleeding. None since then - we called RN at nursing home. She has a history of rectal cancer treated with radiation and recurrent rectal polyp.  Allergies  Allergen Reactions  . Sulfa Antibiotics     Unsure on [facility]   Outpatient Prescriptions Prior to Visit  Medication Sig Dispense Refill  . bisacodyl (DULCOLAX) 10 MG suppository Place 1 suppository (10 mg total) rectally daily.  12 suppository  0  . Calcium Carb-Cholecalciferol (CALCIUM-VITAMIN D) 600-400 MG-UNIT TABS Take 1 tablet by mouth 2 (two) times daily.      . Cholecalciferol 1000 UNITS TBDP Take 1 tablet by mouth daily.      . citalopram (CELEXA) 20 MG tablet Take 20 mg by mouth daily.       . diclofenac (FLECTOR) 1.3 % PTCH Place 1 patch onto the skin daily. Apply  At 8am and take off at 8pm      . docusate (COLACE) 50 MG/5ML liquid Take 100 mg by mouth daily.      Marland Kitchen ENSURE PLUS (ENSURE PLUS) LIQD Take 0.5 Cans by mouth 2 (two) times daily.      . hydrocortisone (ANUSOL-HC) 25 MG suppository Place 1 suppository (25 mg total) rectally 2 (two) times daily.  12 suppository  0  . levothyroxine (SYNTHROID, LEVOTHROID) 50 MCG tablet Take 50 mcg by mouth daily.      . Memantine HCl ER (NAMENDA XR) 28 MG CP24 Take 28 mg by mouth daily.      . Multiple Vitamins-Minerals (CERTAVITE/ANTIOXIDANTS PO) Take 1 tablet by mouth daily.      . pantoprazole (PROTONIX) 40 MG tablet Take 1 tablet (40 mg total) by mouth daily.  30 tablet  0  . Pollen Extracts (PROSTAT PO) Take 30 mLs by mouth 2 (two) times daily.      . polyethylene glycol powder (GLYCOLAX/MIRALAX) powder Take 17 g by mouth daily.       . rivastigmine (EXELON) 9.5 mg/24hr Place 1 patch onto the skin daily.      . tamsulosin (FLOMAX) 0.4 MG CAPS capsule Take 1 capsule (0.4 mg total) by mouth daily after supper.   30 capsule  0  . acetaminophen (TYLENOL) 500 MG tablet Take 1,000 mg by mouth every 4 (four) hours as needed for mild pain or fever.      Marland Kitchen amLODipine (NORVASC) 10 MG tablet Take 1 tablet (10 mg total) by mouth daily.  30 tablet  0  . docusate (COLACE) 50 MG/5ML liquid Take 20 mLs (200 mg total) by mouth 2 (two) times daily.  100 mL  0  . polyethylene glycol (MIRALAX / GLYCOLAX) packet Take 17 g by mouth 2 (two) times daily.  14 each  1   No facility-administered medications prior to visit.   Past Medical History  Diagnosis Date  . Rectal cancer 12/2009    T3N1, chemoradiation therapy  . Hypothyroidism   . Hyperlipidemia   . Uterine cancer 1990's  . Constipation   . Diverticulosis     descending colon & sigmoid colon  . History of kidney stones   . History of pneumonia   . Osteoporosis   . Closed fracture of dorsal (thoracic) vertebra without mention of spinal cord injury   . Closed fracture of lumbar vertebra without mention of  spinal cord injury   . Mitral valve disorders   . Mycotic toenails 02/02/2013  . H/O: GI bleed   . Dementia   . Hiatal hernia    Past Surgical History  Procedure Laterality Date  . Colonoscopy  12/2009  . Spinal injection    . Flexible sigmoidoscopy    . Cataracts    . Abdominal hysterectomy  1996    b/c uterine cancer  . Hammer toe surgery  2008, 2010 & 2011  . Eye surgery Bilateral     Cataract surgery   History   Social History  . Marital Status: Widowed    Spouse Name: N/A    Number of Children: 3  . Years of Education: N/A   Occupational History  . retired    Social History Main Topics  . Smoking status: Never Smoker   . Smokeless tobacco: Never Used  . Alcohol Use: No  . Drug Use: No  . Sexual Activity: None   Other Topics Concern  . None   Social History Narrative   Widowed, native of [place], lived in [place] area, moved back to [place] area 2012 or 2013.   2 sons one daughter. Daughter lives in [place] and [place]         Family History  Problem Relation Age of Onset  . Heart disease Father   . Aneurysm Father     AAA  . Coronary artery disease Mother   . Stroke Mother   . Alzheimer's disease Brother   . Muscular dystrophy Sister   . Heart attack Son   . Muscular dystrophy Sister   . Heart disease Brother   . Muscular dystrophy Sister   . Colon polyps Neg Hx   . Esophageal cancer Neg Hx   . Stomach cancer Neg Hx   . Rectal cancer Neg Hx        Review of Systems As above    Objective:   Physical Exam General:  NAD , elderly demented ww in wheelchair Eyes:   anicteric Lungs:  clear Heart:  S1S2 no rubs, murmurs or gallops Abdomen:  soft and nontender, BS+ Ext:   no edema     Data Reviewed:     Assessment & Plan:  GI bleed  Adenomatous rectal polyp  History of colon cancer  Although elderly and demented she has had bleeding after XRT for rectal cancer and known regrowth of adenoma in rectum, last sigmoidoscopy 01/2013.  Plan to evaluate with colonoscopy or just sigmoidoscopy - will arrange this and discuss with her daughter POA by phone when we can.

## 2013-12-06 ENCOUNTER — Telehealth: Payer: Self-pay

## 2013-12-06 MED ORDER — MOVIPREP 100 G PO SOLR: ORAL | Status: DC

## 2013-12-06 NOTE — Telephone Encounter (Signed)
Spoke to [place] [phone]) and informed her of mom's colonoscopy appointment on 12/27/13 at 1:30pm, arrive at 12:00pm at [facility].  I told her the instructions have been faxed to [facility] at [phone], ATT: Persina.  Dawn said she will be speaking with them before the procedure to confirm transportation. Yesterday when I spoke to [facility] she gave verbal consent for the colonoscopy to be done, she is the POA.

## 2013-12-27 ENCOUNTER — Encounter (HOSPITAL_COMMUNITY): Payer: Self-pay

## 2013-12-27 ENCOUNTER — Encounter (HOSPITAL_COMMUNITY)
Admission: RE | Disposition: A | Discharge status: Home / Self Care | Payer: Medicare Other | Source: Ambulatory Visit | Attending: Internal Medicine

## 2013-12-27 ENCOUNTER — Ambulatory Visit (HOSPITAL_COMMUNITY)
Admission: RE | Admit: 2013-12-27 | Discharge: 2013-12-27 | Disposition: A | Discharge status: Home / Self Care | Payer: Medicare Other | Source: Ambulatory Visit | Attending: Internal Medicine | Admitting: Internal Medicine

## 2013-12-27 DIAGNOSIS — K626 Ulcer of anus and rectum: Secondary | ICD-10-CM | POA: Diagnosis present

## 2013-12-27 DIAGNOSIS — Z860100 Personal history of colon polyps, unspecified: Secondary | ICD-10-CM | POA: Insufficient documentation

## 2013-12-27 DIAGNOSIS — Z79899 Other long term (current) drug therapy: Secondary | ICD-10-CM | POA: Insufficient documentation

## 2013-12-27 DIAGNOSIS — D129 Benign neoplasm of anus and anal canal: Principal | ICD-10-CM

## 2013-12-27 DIAGNOSIS — E039 Hypothyroidism, unspecified: Secondary | ICD-10-CM | POA: Insufficient documentation

## 2013-12-27 DIAGNOSIS — F0390 Unspecified dementia without behavioral disturbance: Secondary | ICD-10-CM | POA: Insufficient documentation

## 2013-12-27 DIAGNOSIS — K5730 Diverticulosis of large intestine without perforation or abscess without bleeding: Secondary | ICD-10-CM | POA: Insufficient documentation

## 2013-12-27 DIAGNOSIS — Y9210 Unspecified residential institution as the place of occurrence of the external cause: Secondary | ICD-10-CM | POA: Insufficient documentation

## 2013-12-27 DIAGNOSIS — K922 Gastrointestinal hemorrhage, unspecified: Secondary | ICD-10-CM

## 2013-12-27 DIAGNOSIS — K921 Melena: Secondary | ICD-10-CM | POA: Insufficient documentation

## 2013-12-27 DIAGNOSIS — Z882 Allergy status to sulfonamides status: Secondary | ICD-10-CM | POA: Insufficient documentation

## 2013-12-27 DIAGNOSIS — Z85048 Personal history of other malignant neoplasm of rectum, rectosigmoid junction, and anus: Secondary | ICD-10-CM | POA: Insufficient documentation

## 2013-12-27 DIAGNOSIS — D128 Benign neoplasm of rectum: Secondary | ICD-10-CM | POA: Insufficient documentation

## 2013-12-27 DIAGNOSIS — Z8542 Personal history of malignant neoplasm of other parts of uterus: Secondary | ICD-10-CM | POA: Insufficient documentation

## 2013-12-27 DIAGNOSIS — Y842 Radiological procedure and radiotherapy as the cause of abnormal reaction of the patient, or of later complication, without mention of misadventure at the time of the procedure: Secondary | ICD-10-CM | POA: Insufficient documentation

## 2013-12-27 DIAGNOSIS — Z85038 Personal history of other malignant neoplasm of large intestine: Secondary | ICD-10-CM

## 2013-12-27 DIAGNOSIS — E785 Hyperlipidemia, unspecified: Secondary | ICD-10-CM | POA: Insufficient documentation

## 2013-12-27 HISTORY — PX: FLEXIBLE SIGMOIDOSCOPY: SHX5431

## 2013-12-27 SURGERY — SIGMOIDOSCOPY, FLEXIBLE
Anesthesia: Moderate Sedation

## 2013-12-27 MED ORDER — MIDAZOLAM HCL 10 MG/2ML IJ SOLN
INTRAMUSCULAR | Status: AC
  Filled 2013-12-27: qty 2

## 2013-12-27 MED ORDER — MIDAZOLAM HCL 5 MG/5ML IJ SOLN
INTRAMUSCULAR | Status: DC | PRN
  Administered 2013-12-27 (×2): 1 mg via INTRAVENOUS

## 2013-12-27 MED ORDER — FENTANYL CITRATE 0.05 MG/ML IJ SOLN
INTRAMUSCULAR | Status: DC | PRN
  Administered 2013-12-27: 25 ug via INTRAVENOUS

## 2013-12-27 MED ORDER — DIPHENHYDRAMINE HCL 50 MG/ML IJ SOLN
INTRAMUSCULAR | Status: AC
  Filled 2013-12-27: qty 1

## 2013-12-27 MED ORDER — SODIUM CHLORIDE 0.9 % IV SOLN
INTRAVENOUS | Status: DC
  Administered 2013-12-27: 500 mL via INTRAVENOUS

## 2013-12-27 MED ORDER — FENTANYL CITRATE 0.05 MG/ML IJ SOLN
INTRAMUSCULAR | Status: AC
  Filled 2013-12-27: qty 2

## 2013-12-27 NOTE — Op Note (Addendum)
[facility] [place], [address]   FLEXIBLE SIGMOIDOSCOPY PROCEDURE REPORT  PATIENT: Sarah [name], Sarah [name]  MR#: [id number] BIRTHDATE: [birth year] , 27  yrs. old GENDER: Female ENDOSCOPIST: Gatha Mayer, MD, [facility] PROCEDURE DATE:  12/27/2013 PROCEDURE:   Sigmoidoscopy with biopsy ASA CLASS:   Class III INDICATIONS:hematochezia. history of rectal cancer Tx w/ XRT MEDICATIONS: Fentanyl 25 mcg IV and Versed 2 mg IV  DESCRIPTION OF PROCEDURE:   After the risks benefits and alternatives of the procedure were thoroughly explained, informed consent was obtained.  revealed no abnormalities of the rectum. The Pentax Colonoscope [id number])  endoscope was introduced through the anus  and advanced to the descending colon , limited by No adverse events experienced.   The quality of the prep was good . The instrument was then slowly withdrawn as the mucosa was fully examined.  1.  FINDINGS: A small ulcer was found in the rectum.  Multiple biopsies were performed using cold forceps.  There were some teangiectatic changes consistent with radiation changes/proctitis. 2.  Moderate diverticulosis was noted in the sigmoid colon. 3.  The colon mucosa was otherwise normal.  Exam to descending colon Retroflexed views revealed no abnormalities.    The scope was then withdrawn from the patient and the procedure terminated.  COMPLICATIONS: There were no complications.  ENDOSCOPIC IMPRESSION: 1.   Small ulcer in the rectum; multiple biopsies were performed using cold forceps 2.   Moderate diverticulosis was noted in the sigmoid colon 3.   The colon mucosa was otherwise normal  RECOMMENDATIONS: 1.  await biopsy results 2.  Stop hydrocortisone suppository  eSigned:  Gatha Mayer, MD, Marval Regal 12/27/2013 2:13 PM C:Bradley Benay Spice, MD

## 2013-12-27 NOTE — Interval H&P Note (Signed)
History and Physical Interval Note:  12/27/2013 1:45 PM  Sarah [name]  has presented today for surgery, with the diagnosis of hx of colon cancer adenomatous rectal polyp  The various methods of treatment have been discussed with the patient and family. After consideration of risks, benefits and other options for treatment, the patient has consented to  Procedure(s): COLONOSCOPY (N/A) HOT HEMOSTASIS (ARGON PLASMA COAGULATION/BICAP) (N/A) as a surgical intervention .  The patient's history has been reviewed, patient examined, no change in status, stable for surgery.  I have reviewed the patient's chart and labs.  Questions were answered to the patient's satisfaction.     Gatha Mayer

## 2013-12-27 NOTE — H&P (View-Only) (Signed)
Subjective:    Patient ID: Sarah [name], female    DOB: 05-08-1924, 80 y.o.   MRN: [id number]  HPI Pleasant elderly demented woman here after hospitalization for rectal bleeding. None since then - we called RN at nursing home. She has a history of rectal cancer treated with radiation and recurrent rectal polyp.  Allergies  Allergen Reactions  . Sulfa Antibiotics     Unsure on [facility]   Outpatient Prescriptions Prior to Visit  Medication Sig Dispense Refill  . bisacodyl (DULCOLAX) 10 MG suppository Place 1 suppository (10 mg total) rectally daily.  12 suppository  0  . Calcium Carb-Cholecalciferol (CALCIUM-VITAMIN D) 600-400 MG-UNIT TABS Take 1 tablet by mouth 2 (two) times daily.      . Cholecalciferol 1000 UNITS TBDP Take 1 tablet by mouth daily.      . citalopram (CELEXA) 20 MG tablet Take 20 mg by mouth daily.       . diclofenac (FLECTOR) 1.3 % PTCH Place 1 patch onto the skin daily. Apply  At 8am and take off at 8pm      . docusate (COLACE) 50 MG/5ML liquid Take 100 mg by mouth daily.      Marland Kitchen ENSURE PLUS (ENSURE PLUS) LIQD Take 0.5 Cans by mouth 2 (two) times daily.      . hydrocortisone (ANUSOL-HC) 25 MG suppository Place 1 suppository (25 mg total) rectally 2 (two) times daily.  12 suppository  0  . levothyroxine (SYNTHROID, LEVOTHROID) 50 MCG tablet Take 50 mcg by mouth daily.      . Memantine HCl ER (NAMENDA XR) 28 MG CP24 Take 28 mg by mouth daily.      . Multiple Vitamins-Minerals (CERTAVITE/ANTIOXIDANTS PO) Take 1 tablet by mouth daily.      . pantoprazole (PROTONIX) 40 MG tablet Take 1 tablet (40 mg total) by mouth daily.  30 tablet  0  . Pollen Extracts (PROSTAT PO) Take 30 mLs by mouth 2 (two) times daily.      . polyethylene glycol powder (GLYCOLAX/MIRALAX) powder Take 17 g by mouth daily.       . rivastigmine (EXELON) 9.5 mg/24hr Place 1 patch onto the skin daily.      . tamsulosin (FLOMAX) 0.4 MG CAPS capsule Take 1 capsule (0.4 mg total) by mouth daily after supper.   30 capsule  0  . acetaminophen (TYLENOL) 500 MG tablet Take 1,000 mg by mouth every 4 (four) hours as needed for mild pain or fever.      Marland Kitchen amLODipine (NORVASC) 10 MG tablet Take 1 tablet (10 mg total) by mouth daily.  30 tablet  0  . docusate (COLACE) 50 MG/5ML liquid Take 20 mLs (200 mg total) by mouth 2 (two) times daily.  100 mL  0  . polyethylene glycol (MIRALAX / GLYCOLAX) packet Take 17 g by mouth 2 (two) times daily.  14 each  1   No facility-administered medications prior to visit.   Past Medical History  Diagnosis Date  . Rectal cancer 12/2009    T3N1, chemoradiation therapy  . Hypothyroidism   . Hyperlipidemia   . Uterine cancer 1990's  . Constipation   . Diverticulosis     descending colon & sigmoid colon  . History of kidney stones   . History of pneumonia   . Osteoporosis   . Closed fracture of dorsal (thoracic) vertebra without mention of spinal cord injury   . Closed fracture of lumbar vertebra without mention of  spinal cord injury   . Mitral valve disorders   . Mycotic toenails 02/02/2013  . H/O: GI bleed   . Dementia   . Hiatal hernia    Past Surgical History  Procedure Laterality Date  . Colonoscopy  12/2009  . Spinal injection    . Flexible sigmoidoscopy    . Cataracts    . Abdominal hysterectomy  1996    b/c uterine cancer  . Hammer toe surgery  2008, 2010 & 2011  . Eye surgery Bilateral     Cataract surgery   History   Social History  . Marital Status: Widowed    Spouse Name: N/A    Number of Children: 3  . Years of Education: N/A   Occupational History  . retired    Social History Main Topics  . Smoking status: Never Smoker   . Smokeless tobacco: Never Used  . Alcohol Use: No  . Drug Use: No  . Sexual Activity: None   Other Topics Concern  . None   Social History Narrative   Widowed, native of [place], lived in [place] area, moved back to [place] area 2012 or 2013.   2 sons one daughter. Daughter lives in [place] and [place]         Family History  Problem Relation Age of Onset  . Heart disease Father   . Aneurysm Father     AAA  . Coronary artery disease Mother   . Stroke Mother   . Alzheimer's disease Brother   . Muscular dystrophy Sister   . Heart attack Son   . Muscular dystrophy Sister   . Heart disease Brother   . Muscular dystrophy Sister   . Colon polyps Neg Hx   . Esophageal cancer Neg Hx   . Stomach cancer Neg Hx   . Rectal cancer Neg Hx        Review of Systems As above    Objective:   Physical Exam General:  NAD , elderly demented ww in wheelchair Eyes:   anicteric Lungs:  clear Heart:  S1S2 no rubs, murmurs or gallops Abdomen:  soft and nontender, BS+ Ext:   no edema     Data Reviewed:     Assessment & Plan:  GI bleed  Adenomatous rectal polyp  History of colon cancer  Although elderly and demented she has had bleeding after XRT for rectal cancer and known regrowth of adenoma in rectum, last sigmoidoscopy 01/2013.  Plan to evaluate with colonoscopy or just sigmoidoscopy - will arrange this and discuss with her daughter POA by phone when we can.

## 2013-12-27 NOTE — Discharge Instructions (Signed)
There was a rectal ulcer - I took biopsies of this. Slight rectal bleeding from this may be seen and is not a cause for alarm. There was also diverticulosis (colon) and radiation change (rectum). I will communicate pathology results to the patient's daughter.  Gatha Mayer, MD, FACG   YOU HAD AN ENDOSCOPIC PROCEDURE TODAY: Refer to the procedure report and other information in the discharge instructions given to you for any specific questions about what was found during the examination. If this information does not answer your questions, please call Dr. Celesta Aver office at [phone] to clarify.   YOU SHOULD EXPECT: Some feelings of bloating in the abdomen. Passage of more gas than usual. Walking can help get rid of the air that was put into your GI tract during the procedure and reduce the bloating. If you had a lower endoscopy (such as a colonoscopy or flexible sigmoidoscopy) you may notice spotting of blood in your stool or on the toilet paper. Some abdominal soreness may be present for a day or two, also.  DIET: Your first meal following the procedure should be a light meal and then it is ok to progress to your normal diet. A half-sandwich or bowl of soup is an example of a good first meal. Heavy or fried foods are harder to digest and may make you feel nauseous or bloated. Drink plenty of fluids but you should avoid alcoholic beverages for 24 hours.   ACTIVITY: Your care partner should take you home directly after the procedure. You should plan to take it easy, moving slowly for the rest of the day. You can resume normal activity the day after the procedure however YOU SHOULD NOT DRIVE, use power tools, machinery or perform tasks that involve climbing or major physical exertion for 24 hours (because of the sedation medicines used during the test).   SYMPTOMS TO REPORT IMMEDIATELY: A gastroenterologist can be reached at any hour. Please call [phone]  for any of the following symptoms:    Following lower endoscopy (colonoscopy, flexible sigmoidoscopy) Excessive amounts of blood in the stool  Significant tenderness, worsening of abdominal pains  Swelling of the abdomen that is new, acute  Fever of 100 or higher   FOLLOW UP:  If any biopsies were taken you will be contacted by phone or by letter within the next 1-3 weeks. Call [phone]  if you have not heard about the biopsies in 3 weeks.  Please also call with any specific questions about appointments or follow up tests.

## 2013-12-28 ENCOUNTER — Encounter (HOSPITAL_COMMUNITY): Payer: Self-pay | Admitting: Internal Medicine

## 2013-12-29 NOTE — Progress Notes (Signed)
Quick Note:  Please let daughter know that there was polyp (benign) but no cancer  Ulcer probably related to constipation  Office visit recall 1 year  Copy of pathology to PCP also ______

## 2014-01-06 ENCOUNTER — Emergency Department (HOSPITAL_COMMUNITY): Payer: Medicare Other

## 2014-01-06 ENCOUNTER — Emergency Department (HOSPITAL_COMMUNITY)
Admission: EM | Admit: 2014-01-06 | Discharge: 2014-01-06 | Disposition: A | Discharge status: Home / Self Care | Payer: Medicare Other | Attending: Emergency Medicine | Admitting: Emergency Medicine

## 2014-01-06 ENCOUNTER — Encounter (HOSPITAL_COMMUNITY): Payer: Self-pay | Admitting: Emergency Medicine

## 2014-01-06 DIAGNOSIS — R296 Repeated falls: Secondary | ICD-10-CM | POA: Insufficient documentation

## 2014-01-06 DIAGNOSIS — S02109A Fracture of base of skull, unspecified side, initial encounter for closed fracture: Secondary | ICD-10-CM | POA: Insufficient documentation

## 2014-01-06 DIAGNOSIS — W19XXXA Unspecified fall, initial encounter: Secondary | ICD-10-CM

## 2014-01-06 DIAGNOSIS — Z8542 Personal history of malignant neoplasm of other parts of uterus: Secondary | ICD-10-CM | POA: Insufficient documentation

## 2014-01-06 DIAGNOSIS — S42292A Other displaced fracture of upper end of left humerus, initial encounter for closed fracture: Secondary | ICD-10-CM

## 2014-01-06 DIAGNOSIS — Z8781 Personal history of (healed) traumatic fracture: Secondary | ICD-10-CM | POA: Insufficient documentation

## 2014-01-06 DIAGNOSIS — F0390 Unspecified dementia without behavioral disturbance: Secondary | ICD-10-CM | POA: Insufficient documentation

## 2014-01-06 DIAGNOSIS — S42213A Unspecified displaced fracture of surgical neck of unspecified humerus, initial encounter for closed fracture: Secondary | ICD-10-CM | POA: Insufficient documentation

## 2014-01-06 DIAGNOSIS — Z8719 Personal history of other diseases of the digestive system: Secondary | ICD-10-CM | POA: Insufficient documentation

## 2014-01-06 DIAGNOSIS — Y929 Unspecified place or not applicable: Secondary | ICD-10-CM | POA: Insufficient documentation

## 2014-01-06 DIAGNOSIS — K5900 Constipation, unspecified: Secondary | ICD-10-CM | POA: Insufficient documentation

## 2014-01-06 DIAGNOSIS — Z79899 Other long term (current) drug therapy: Secondary | ICD-10-CM | POA: Insufficient documentation

## 2014-01-06 DIAGNOSIS — Z8701 Personal history of pneumonia (recurrent): Secondary | ICD-10-CM | POA: Insufficient documentation

## 2014-01-06 DIAGNOSIS — Z87442 Personal history of urinary calculi: Secondary | ICD-10-CM | POA: Insufficient documentation

## 2014-01-06 DIAGNOSIS — E039 Hypothyroidism, unspecified: Secondary | ICD-10-CM | POA: Insufficient documentation

## 2014-01-06 DIAGNOSIS — S02401A Maxillary fracture, unspecified, initial encounter for closed fracture: Secondary | ICD-10-CM

## 2014-01-06 DIAGNOSIS — S0990XA Unspecified injury of head, initial encounter: Secondary | ICD-10-CM | POA: Insufficient documentation

## 2014-01-06 DIAGNOSIS — Y9389 Activity, other specified: Secondary | ICD-10-CM | POA: Insufficient documentation

## 2014-01-06 DIAGNOSIS — Z8739 Personal history of other diseases of the musculoskeletal system and connective tissue: Secondary | ICD-10-CM | POA: Insufficient documentation

## 2014-01-06 DIAGNOSIS — E785 Hyperlipidemia, unspecified: Secondary | ICD-10-CM | POA: Insufficient documentation

## 2014-01-06 DIAGNOSIS — Z85048 Personal history of other malignant neoplasm of rectum, rectosigmoid junction, and anus: Secondary | ICD-10-CM | POA: Insufficient documentation

## 2014-01-06 LAB — I-STAT CHEM 8, ED
BUN: 19 mg/dL (ref 6–23)
Calcium, Ion: 1.29 mmol/L (ref 1.13–1.30)
Chloride: 101 mEq/L (ref 96–112)
Creatinine, Ser: 0.9 mg/dL (ref 0.50–1.10)
Glucose, Bld: 140 mg/dL — ABNORMAL HIGH (ref 70–99)
HCT: 40 % (ref 36.0–46.0)
Hemoglobin: 13.6 g/dL (ref 12.0–15.0)
Potassium: 3.2 mEq/L — ABNORMAL LOW (ref 3.7–5.3)
Sodium: 147 mEq/L (ref 137–147)
TCO2: 31 mmol/L (ref 0–100)

## 2014-01-06 LAB — CBG MONITORING, ED: Glucose-Capillary: 137 mg/dL — ABNORMAL HIGH (ref 70–99)

## 2014-01-06 MED ORDER — HYDROCODONE-ACETAMINOPHEN 5-325 MG PO TABS: 1.0000 | ORAL_TABLET | Freq: Four times a day (QID) | ORAL | Status: AC | PRN

## 2014-01-06 NOTE — ED Notes (Signed)
Notified EDP, Zavitz,MD. Pt. i-stat Chem 8 results potassium 3.2.

## 2014-01-06 NOTE — Discharge Instructions (Signed)
Use ice and Tylenol for pain. No weightbearing with left shoulder or arm until cleared by orthopedics. Wear sling as directed. If you were given medicines take as directed.  If you are on coumadin or contraceptives realize their levels and effectiveness is altered by many different medicines.  If you have any reaction (rash, tongues swelling, other) to the medicines stop taking and see a physician.   Please follow up as directed and return to the ER or see a physician for new or worsening symptoms.  Thank you. Filed Vitals:   01/06/14 0400  BP: 168/85  Pulse: 90  Resp: 16  SpO2: 92%

## 2014-01-06 NOTE — ED Provider Notes (Signed)
CSN: [id number]     Arrival date & time 01/06/14  0358 History   First MD Initiated Contact with Patient 01/06/14 0408     Chief Complaint  Patient presents with  . Shoulder Pain  . Dislocation     (Consider location/radiation/quality/duration/timing/severity/associated sxs/prior Treatment) HPI Patient presents to the emergency department following a fall that occurred just prior to arrival.  The patient lives in an assisted-living facility and was found on the floor while attempting to get to the bathroom.  The patient has swelling noted to the left eye bruising to the left shoulder swollen lip and bruised knees.  The patient is unable to communicate with me about her injuries due to dementia.  The patient is normally not able to give any history Past Medical History  Diagnosis Date  . Rectal cancer 12/2009    T3N1, chemoradiation therapy  . Hypothyroidism   . Hyperlipidemia   . Uterine cancer 1990's  . Constipation   . Diverticulosis     descending colon & sigmoid colon  . History of kidney stones   . History of pneumonia   . Osteoporosis   . Closed fracture of dorsal (thoracic) vertebra without mention of spinal cord injury   . Closed fracture of lumbar vertebra without mention of spinal cord injury   . Mitral valve disorders   . Mycotic toenails 02/02/2013  . H/O: GI bleed   . Dementia   . Hiatal hernia    Past Surgical History  Procedure Laterality Date  . Colonoscopy  12/2009  . Spinal injection    . Flexible sigmoidoscopy    . Cataracts    . Abdominal hysterectomy  1996    b/c uterine cancer  . Hammer toe surgery  2008, 2010 & 2011  . Eye surgery Bilateral     Cataract surgery  . Flexible sigmoidoscopy N/A 12/27/2013    Procedure: FLEXIBLE SIGMOIDOSCOPY;  Surgeon: Gatha Mayer, MD;  Location: [facility];  Service: Endoscopy;  Laterality: N/A;   Family History  Problem Relation Age of Onset  . Heart disease Father   . Aneurysm Father     AAA  . Coronary artery  disease Mother   . Stroke Mother   . Alzheimer's disease Brother   . Muscular dystrophy Sister   . Heart attack Son   . Muscular dystrophy Sister   . Heart disease Brother   . Muscular dystrophy Sister   . Colon polyps Neg Hx   . Esophageal cancer Neg Hx   . Stomach cancer Neg Hx   . Rectal cancer Neg Hx    History  Substance Use Topics  . Smoking status: Never Smoker   . Smokeless tobacco: Never Used  . Alcohol Use: No   OB History   Grav Para Term Preterm Abortions TAB SAB Ect Mult Living                 Review of Systems  Level V caveat applies due to dementia  Allergies  Sulfa antibiotics  Home Medications   Prior to Admission medications   Medication Sig Start Date End Date Taking? Authorizing Provider  acetaminophen (TYLENOL) 500 MG tablet Take 500 mg by mouth every 4 (four) hours as needed for mild pain.   Yes Historical Provider, MD  bisacodyl (DULCOLAX) 10 MG suppository Place 1 suppository (10 mg total) rectally daily. 10/15/13  Yes Thurnell Lose, MD  Calcium Carb-Cholecalciferol (CALCIUM-VITAMIN D) 600-400 MG-UNIT TABS Take 1 tablet by mouth 2 (two) times  daily.   Yes Historical Provider, MD  Cholecalciferol 1000 UNITS TBDP Take 1 tablet by mouth daily.   Yes Historical Provider, MD  citalopram (CELEXA) 20 MG tablet Take 20 mg by mouth daily.  06/23/13  Yes Historical Provider, MD  docusate (COLACE) 50 MG/5ML liquid Take 100 mg by mouth daily.   Yes Historical Provider, MD  ENSURE PLUS (ENSURE PLUS) LIQD Take 0.5 Cans by mouth 2 (two) times daily.   Yes Historical Provider, MD  levothyroxine (SYNTHROID, LEVOTHROID) 50 MCG tablet Take 50 mcg by mouth daily.   Yes Historical Provider, MD  Memantine HCl ER (NAMENDA XR) 28 MG CP24 Take 28 mg by mouth daily.   Yes Historical Provider, MD  Multiple Vitamins-Minerals (CERTAVITE/ANTIOXIDANTS PO) Take 1 tablet by mouth daily.   Yes Historical Provider, MD  pantoprazole (PROTONIX) 40 MG tablet Take 1 tablet (40 mg  total) by mouth daily. 10/15/13  Yes Thurnell Lose, MD  Pollen Extracts (PROSTAT PO) Take 30 mLs by mouth 2 (two) times daily.   Yes Historical Provider, MD  polyethylene glycol powder (GLYCOLAX/MIRALAX) powder Take 17 g by mouth every other day.  10/11/12  Yes Historical Provider, MD  rivastigmine (EXELON) 9.5 mg/24hr Place 1 patch onto the skin daily.   Yes Historical Provider, MD  senna-docusate (SENOKOT-S) 8.6-50 MG per tablet Take 2 tablets by mouth daily.   Yes Historical Provider, MD   BP 168/85  Pulse 90  Resp 16  SpO2 92% Physical Exam  Nursing note and vitals reviewed. Constitutional: She appears well-developed and well-nourished. No distress.  HENT:  Head: Normocephalic. Head is with contusion.  Right Ear: Tympanic membrane normal.  Left Ear: Tympanic membrane normal.  Nose: Nose normal.  Mouth/Throat: Oropharynx is clear and moist.  Eyes: Pupils are equal, round, and reactive to light. Right conjunctiva has no hemorrhage. Left conjunctiva has no hemorrhage.    Cardiovascular: Normal rate, regular rhythm and normal heart sounds.   Pulmonary/Chest: Effort normal and breath sounds normal.  Abdominal: Soft. Bowel sounds are normal. She exhibits no distension.  Musculoskeletal:       Arms:      Legs: Neurological: She is alert.  Skin: Skin is warm and dry.    ED Course  Procedures (including critical care time)  Imaging Review Ct Head Wo Contrast  01/06/2014   CLINICAL DATA:  [place].  Hit head.  EXAM: CT HEAD WITHOUT CONTRAST  CT MAXILLOFACIAL WITHOUT CONTRAST  CT CERVICAL SPINE WITHOUT CONTRAST  TECHNIQUE: Multidetector CT imaging of the head, cervical spine, and maxillofacial structures were performed using the standard protocol without intravenous contrast. Multiplanar CT image reconstructions of the cervical spine and maxillofacial structures were also generated.  COMPARISON:  None.  FINDINGS: CT HEAD FINDINGS  Stable age related cerebral atrophy, ventriculomegaly and  periventricular white matter disease. No extra-axial fluid collections are identified. No CT findings for acute hemispheric infarction or intracranial hemorrhage. No mass lesions. The brainstem and cerebellum are normal.  No acute skull fracture is identified. There is left periorbital hematoma. There is a fracture involving the anterior wall of the left maxillary sinus with mild depression and associated hemorrhage in the sinus.  CT MAXILLOFACIAL FINDINGS  There is a mildly depressed fracture involving the anterior wall of the left maxillary sinus. Depression is estimated at 3 mm. There is associated hemorrhage in the left maxillary sinus. The lateral/posterior wall head medial walls are intact. The zygomas intact. Fluid is also noted in both halves of the sphenoid sinus. The  mastoid air cells and middle ear cavities are clear.  No orbital wall fractures.  The orbital floors are intact.  The mandibular condyles are normally located. No mandible fracture. The globes are intact.  CT CERVICAL SPINE FINDINGS  Advanced degenerative cervical spondylosis with multilevel disc disease and facet disease. Exaggerated thoracic lordosis. There is a compression fracture involving the T1 vertebral body of uncertain age. . I suspected is remote.  IMPRESSION: Age related cerebral atrophy, ventriculomegaly and periventricular white matter disease. No acute intracranial findings or skull fracture.  Mildly depressed fracture involving the anterior wall of the left maxillary sinus. No other facial bone fractures are demonstrated.  Normal alignment of the cervical vertebral bodies and no acute fracture or abnormal prevertebral soft tissue swelling.  T1 compression fracture of uncertain age.   Electronically Signed   By: Kalman Jewels M.D.   On: 01/06/2014 05:29   Ct Cervical Spine Wo Contrast  01/06/2014   CLINICAL DATA:  [place].  Hit head.  EXAM: CT HEAD WITHOUT CONTRAST  CT MAXILLOFACIAL WITHOUT CONTRAST  CT CERVICAL SPINE WITHOUT  CONTRAST  TECHNIQUE: Multidetector CT imaging of the head, cervical spine, and maxillofacial structures were performed using the standard protocol without intravenous contrast. Multiplanar CT image reconstructions of the cervical spine and maxillofacial structures were also generated.  COMPARISON:  None.  FINDINGS: CT HEAD FINDINGS  Stable age related cerebral atrophy, ventriculomegaly and periventricular white matter disease. No extra-axial fluid collections are identified. No CT findings for acute hemispheric infarction or intracranial hemorrhage. No mass lesions. The brainstem and cerebellum are normal.  No acute skull fracture is identified. There is left periorbital hematoma. There is a fracture involving the anterior wall of the left maxillary sinus with mild depression and associated hemorrhage in the sinus.  CT MAXILLOFACIAL FINDINGS  There is a mildly depressed fracture involving the anterior wall of the left maxillary sinus. Depression is estimated at 3 mm. There is associated hemorrhage in the left maxillary sinus. The lateral/posterior wall head medial walls are intact. The zygomas intact. Fluid is also noted in both halves of the sphenoid sinus. The mastoid air cells and middle ear cavities are clear.  No orbital wall fractures.  The orbital floors are intact.  The mandibular condyles are normally located. No mandible fracture. The globes are intact.  CT CERVICAL SPINE FINDINGS  Advanced degenerative cervical spondylosis with multilevel disc disease and facet disease. Exaggerated thoracic lordosis. There is a compression fracture involving the T1 vertebral body of uncertain age. . I suspected is remote.  IMPRESSION: Age related cerebral atrophy, ventriculomegaly and periventricular white matter disease. No acute intracranial findings or skull fracture.  Mildly depressed fracture involving the anterior wall of the left maxillary sinus. No other facial bone fractures are demonstrated.  Normal alignment of  the cervical vertebral bodies and no acute fracture or abnormal prevertebral soft tissue swelling.  T1 compression fracture of uncertain age.   Electronically Signed   By: Kalman Jewels M.D.   On: 01/06/2014 05:29   Ct Maxillofacial Wo Cm  01/06/2014   CLINICAL DATA:  [place].  Hit head.  EXAM: CT HEAD WITHOUT CONTRAST  CT MAXILLOFACIAL WITHOUT CONTRAST  CT CERVICAL SPINE WITHOUT CONTRAST  TECHNIQUE: Multidetector CT imaging of the head, cervical spine, and maxillofacial structures were performed using the standard protocol without intravenous contrast. Multiplanar CT image reconstructions of the cervical spine and maxillofacial structures were also generated.  COMPARISON:  None.  FINDINGS: CT HEAD FINDINGS  Stable age related cerebral atrophy,  ventriculomegaly and periventricular white matter disease. No extra-axial fluid collections are identified. No CT findings for acute hemispheric infarction or intracranial hemorrhage. No mass lesions. The brainstem and cerebellum are normal.  No acute skull fracture is identified. There is left periorbital hematoma. There is a fracture involving the anterior wall of the left maxillary sinus with mild depression and associated hemorrhage in the sinus.  CT MAXILLOFACIAL FINDINGS  There is a mildly depressed fracture involving the anterior wall of the left maxillary sinus. Depression is estimated at 3 mm. There is associated hemorrhage in the left maxillary sinus. The lateral/posterior wall head medial walls are intact. The zygomas intact. Fluid is also noted in both halves of the sphenoid sinus. The mastoid air cells and middle ear cavities are clear.  No orbital wall fractures.  The orbital floors are intact.  The mandibular condyles are normally located. No mandible fracture. The globes are intact.  CT CERVICAL SPINE FINDINGS  Advanced degenerative cervical spondylosis with multilevel disc disease and facet disease. Exaggerated thoracic lordosis. There is a compression  fracture involving the T1 vertebral body of uncertain age. . I suspected is remote.  IMPRESSION: Age related cerebral atrophy, ventriculomegaly and periventricular white matter disease. No acute intracranial findings or skull fracture.  Mildly depressed fracture involving the anterior wall of the left maxillary sinus. No other facial bone fractures are demonstrated.  Normal alignment of the cervical vertebral bodies and no acute fracture or abnormal prevertebral soft tissue swelling.  T1 compression fracture of uncertain age.   Electronically Signed   By: Kalman Jewels M.D.   On: 01/06/2014 05:29      Patient will be discharged back to the nursing facility.  She is noted to have a left maxillary sinus fracture, and a left humeral neck fracture.  Patient referred to orthopedics.  Return here as needed  Brent General, PA-C 01/07/14 [id number]

## 2014-01-06 NOTE — ED Notes (Signed)
Bed: WA03 Expected date:  Expected time:  Means of arrival:  Comments: EMS 80yo F; fall, bruising to knee and shoulder; ? Shoulder dislocation

## 2014-01-06 NOTE — ED Notes (Signed)
Pt arrived to the ED with a complaint of a fall with associated left shoulder pain.  Pt has obvious physical deformity to left shoulder.  Pt has bruising to the Left eye and has a swollen lip.  Pt has bilateral bruised knees. Pt's fall was un witnessed.  EMS administered 50 mcg of fentanyl in route.

## 2014-01-07 NOTE — ED Provider Notes (Signed)
Medical screening examination/treatment/procedure(s) were conducted as a shared visit with non-physician practitioner(s) or resident and myself. I personally evaluated the patient during the encounter and agree with the findings and plan unless otherwise indicated.  I have personally reviewed any xrays and/ or EKG's with the provider and I agree with interpretation.  Patient presents from the nursing home with unwitnessed fall and trauma to the face head and left shoulder. Unknown blood thinners. Nose with patient at this time. Patient has ecchymosis and swelling to left eye swollen lip with mild dried blood, pain with all range of motion of left shoulder limiting exam, full range of motion of hips without significant tenderness, no step-offs on the vertebral exam, neck supple and neck support in place, abdomen soft nontender. Plan for CT head neck, x-ray left shoulder, glucose check, pain meds. Sling placed for humeral fx.  Fup outpt with ortho. Labs Reviewed  CBG MONITORING, ED - Abnormal; Notable for the following:    Glucose-Capillary 137 (*)    All other components within normal limits  I-STAT CHEM 8, ED - Abnormal; Notable for the following:    Potassium 3.2 (*)    Glucose, Bld 140 (*)    All other components within normal limits   Fall, acute head injury, sinus fracture left, left humeral fracture   Mariea Clonts, MD 01/07/14 [phone]

## 2014-07-03 DEATH — deceased

## 2014-09-26 IMAGING — CT CT MAXILLOFACIAL W/O CM
3 of 5 series · 16 of 40 positions shown, 18 images · non-contrast
Comparison: None.

CLINICAL DATA: Fell.  Hit head.

EXAM:
CT HEAD WITHOUT CONTRAST
CT MAXILLOFACIAL WITHOUT CONTRAST
CT CERVICAL SPINE WITHOUT CONTRAST
TECHNIQUE: Multidetector CT imaging of the head, cervical spine, and
maxillofacial structures were performed using the standard protocol
without intravenous contrast. Multiplanar CT image reconstructions
of the cervical spine and maxillofacial structures were also
generated.

[Series 3: facial st · axial · 0.30mm/px · z∈[-174,-60]mm · 8 of 75 slices shown, 10 images]
[im 9/75  brain]
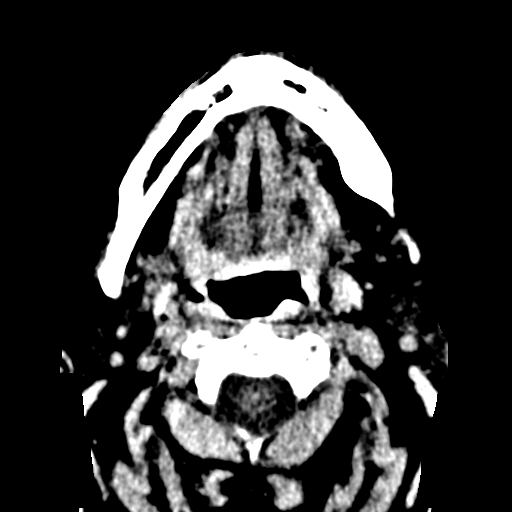
[im 9/75  bone]
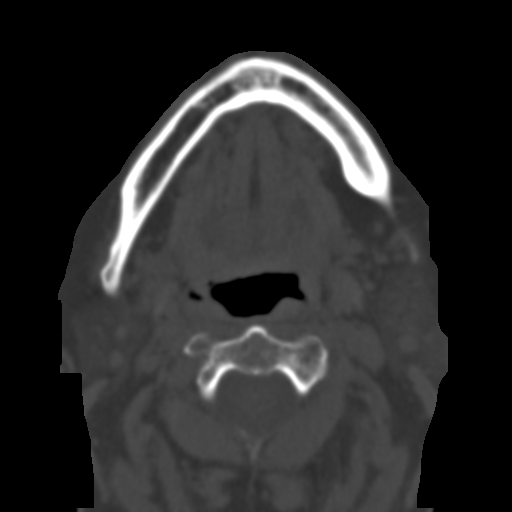
[im 17/75  bone]
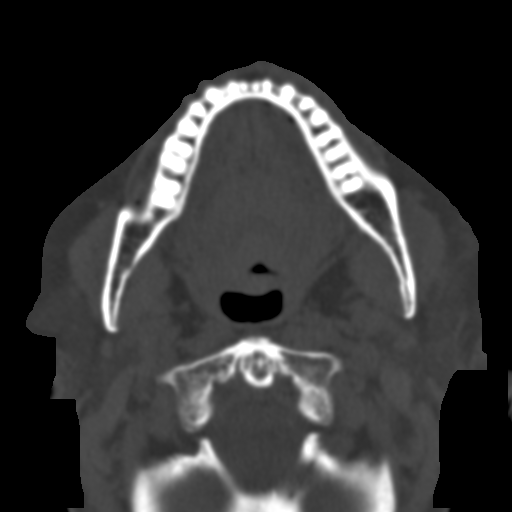
[im 25/75  bone]
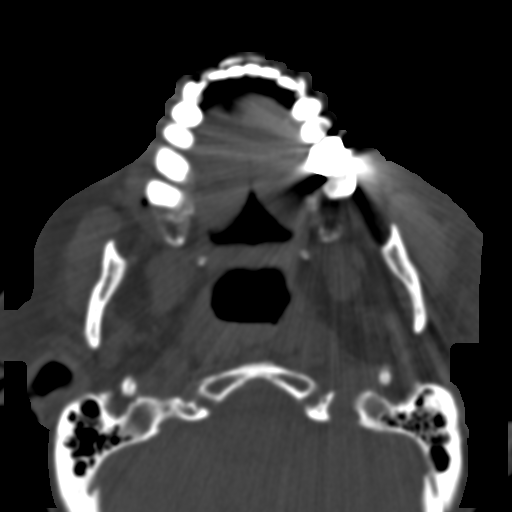
[im 33/75  bone]
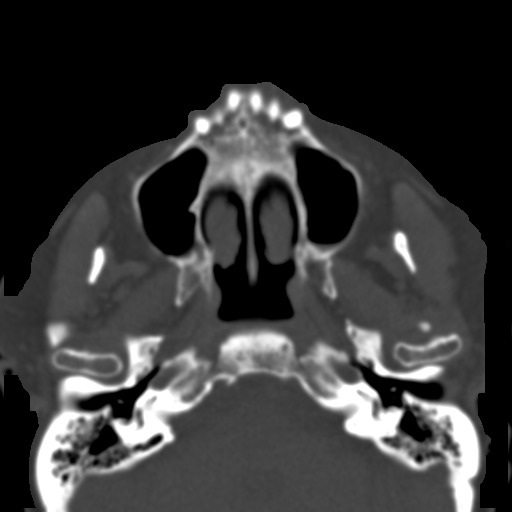
[im 42/75  brain]
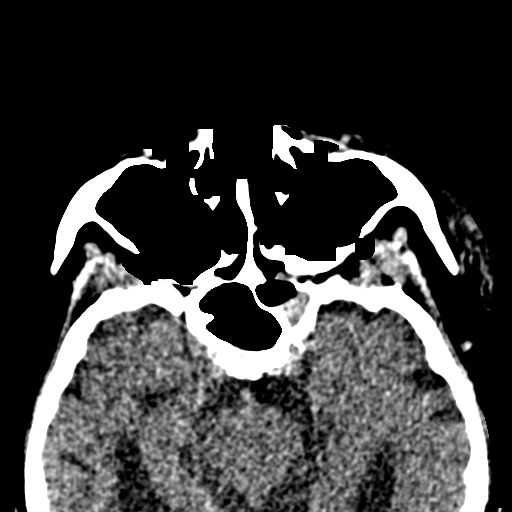
[im 42/75  bone]
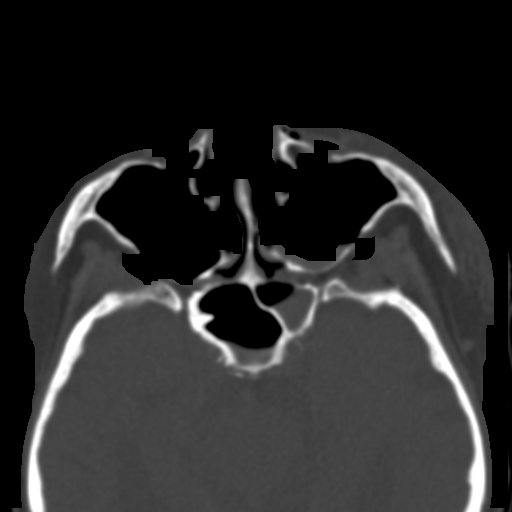
[im 50/75  bone]
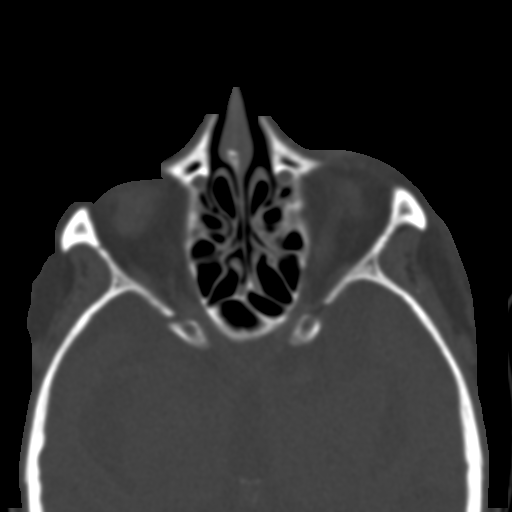
[im 58/75  bone]
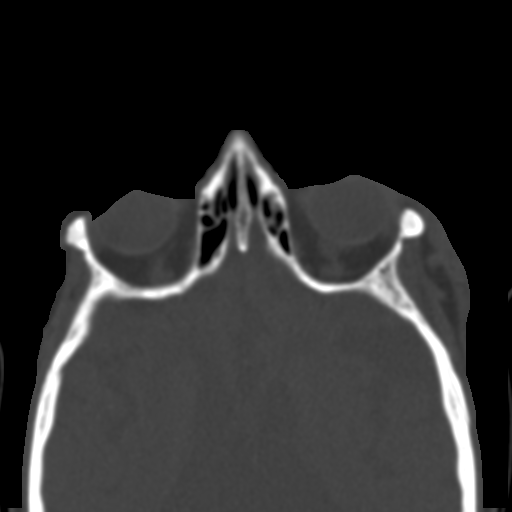
[im 66/75  bone]
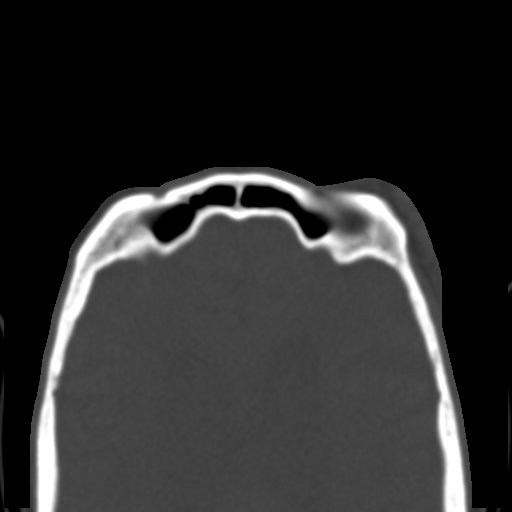

[Series 11: bone windows · axial · 0.43mm/px · z∈[-118,-16]mm · 5 of 52 slices shown]
[im 9/52  bone]
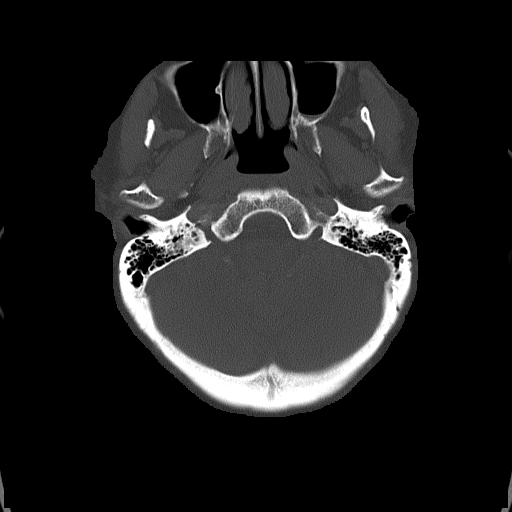
[im 18/52  bone]
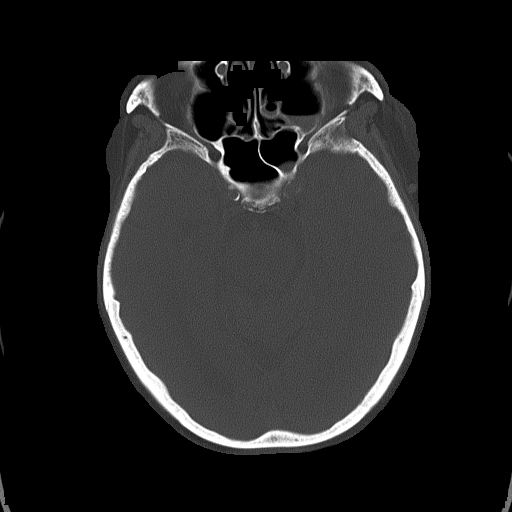
[im 26/52  bone]
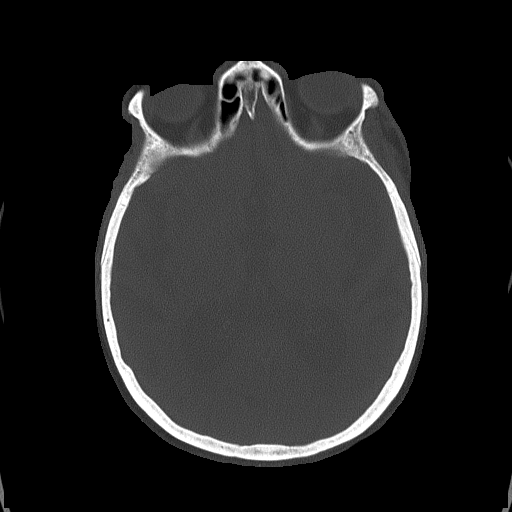
[im 35/52  bone]
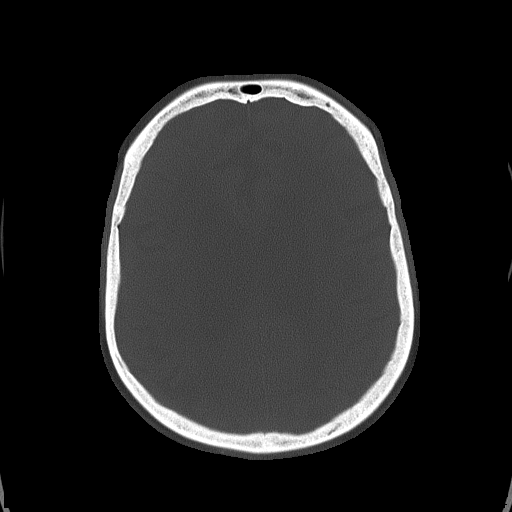
[im 43/52  bone]
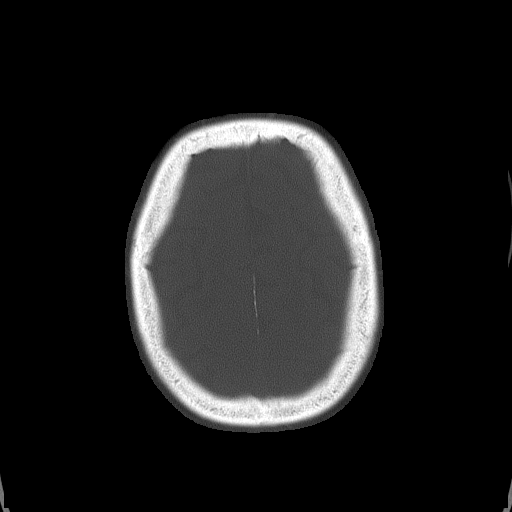

[Series 12: axial · coronal · 0.23mm/px · 3 of 74 slices shown]
[im 25/74  bone]
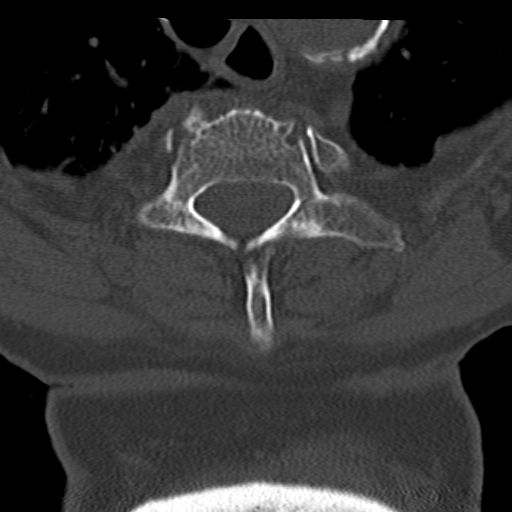
[im 33/74  bone]
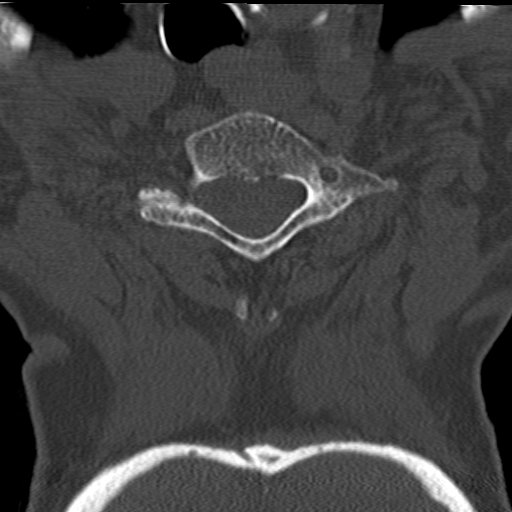
[im 41/74  bone]
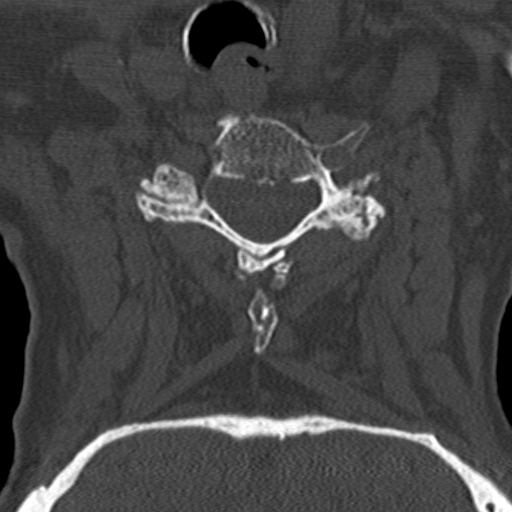

[16 of 40 positions shown; findings below may reference images not displayed]

FINDINGS: CT HEAD FINDINGS

Stable age related cerebral atrophy, ventriculomegaly and
periventricular white matter disease. No extra-axial fluid
collections are identified. No CT findings for acute hemispheric
infarction or intracranial hemorrhage. No mass lesions. The
brainstem and cerebellum are normal.

No acute skull fracture is identified. There is left periorbital
hematoma. There is a fracture involving the anterior wall of the
left maxillary sinus with mild depression and associated hemorrhage
in the sinus.

CT MAXILLOFACIAL FINDINGS

There is a mildly depressed fracture involving the anterior wall of
the left maxillary sinus. Depression is estimated at 3 mm. There is
associated hemorrhage in the left maxillary sinus. The
lateral/posterior wall head medial walls are intact. The zygomas
intact. Fluid is also noted in both halves of the sphenoid sinus.
The mastoid air cells and middle ear cavities are clear.

No orbital wall fractures.  The orbital floors are intact.

The mandibular condyles are normally located. No mandible fracture.
The globes are intact.

CT CERVICAL SPINE FINDINGS

Advanced degenerative cervical spondylosis with multilevel disc
disease and facet disease. Exaggerated thoracic lordosis. There is a
compression fracture involving the T1 vertebral body of uncertain
age. . I suspected is remote.
IMPRESSION: Age related cerebral atrophy, ventriculomegaly and periventricular
white matter disease. No acute intracranial findings or skull
fracture.

Mildly depressed fracture involving the anterior wall of the left
maxillary sinus. No other facial bone fractures are demonstrated.

Normal alignment of the cervical vertebral bodies and no acute
fracture or abnormal prevertebral soft tissue swelling.

T1 compression fracture of uncertain age.

## 2015-02-20 ENCOUNTER — Encounter: Payer: Self-pay | Admitting: Internal Medicine
